# Patient Record
Sex: Male | Born: 1965 | Race: White | Hispanic: No | Smoking: Never smoker
Health system: Southern US, Community
[De-identification: ages and names within clinical notes are randomized; demographics above are authoritative.]

## PROBLEM LIST (undated history)

## (undated) DIAGNOSIS — J45909 Unspecified asthma, uncomplicated: Secondary | ICD-10-CM

## (undated) DIAGNOSIS — K219 Gastro-esophageal reflux disease without esophagitis: Secondary | ICD-10-CM

## (undated) DIAGNOSIS — G4730 Sleep apnea, unspecified: Secondary | ICD-10-CM

## (undated) DIAGNOSIS — M7989 Other specified soft tissue disorders: Secondary | ICD-10-CM

## (undated) DIAGNOSIS — Z87442 Personal history of urinary calculi: Secondary | ICD-10-CM

## (undated) DIAGNOSIS — I10 Essential (primary) hypertension: Secondary | ICD-10-CM

## (undated) DIAGNOSIS — E119 Type 2 diabetes mellitus without complications: Secondary | ICD-10-CM

## (undated) DIAGNOSIS — F32A Depression, unspecified: Secondary | ICD-10-CM

## (undated) DIAGNOSIS — F419 Anxiety disorder, unspecified: Secondary | ICD-10-CM

## (undated) DIAGNOSIS — F329 Major depressive disorder, single episode, unspecified: Secondary | ICD-10-CM

## (undated) HISTORY — PX: OTHER SURGICAL HISTORY: SHX169

## (undated) HISTORY — PX: WISDOM TOOTH EXTRACTION: SHX21

## (undated) HISTORY — PX: TENNIS ELBOW RELEASE/NIRSCHEL PROCEDURE: SHX6651

## (undated) HISTORY — PX: HERNIA REPAIR: SHX51

## (undated) HISTORY — PX: CARPAL TUNNEL RELEASE: SHX101

---

## 2002-05-05 ENCOUNTER — Encounter: Payer: Self-pay | Admitting: Orthopedic Surgery

## 2002-05-05 ENCOUNTER — Ambulatory Visit (HOSPITAL_COMMUNITY): Admission: RE | Admit: 2002-05-05 | Discharge: 2002-05-05 | Payer: Self-pay | Admitting: Orthopedic Surgery

## 2005-03-13 ENCOUNTER — Ambulatory Visit: Payer: Self-pay

## 2005-04-02 ENCOUNTER — Ambulatory Visit: Payer: Self-pay

## 2005-06-19 ENCOUNTER — Ambulatory Visit: Payer: Self-pay

## 2005-06-26 ENCOUNTER — Ambulatory Visit: Payer: Self-pay | Admitting: Internal Medicine

## 2005-08-22 ENCOUNTER — Ambulatory Visit (HOSPITAL_COMMUNITY): Admission: RE | Admit: 2005-08-22 | Discharge: 2005-08-22 | Payer: Self-pay | Admitting: Orthopedic Surgery

## 2007-01-26 ENCOUNTER — Emergency Department: Payer: Self-pay | Admitting: Emergency Medicine

## 2008-05-06 ENCOUNTER — Emergency Department: Payer: Self-pay | Admitting: Emergency Medicine

## 2014-03-22 ENCOUNTER — Ambulatory Visit: Payer: Self-pay | Admitting: General Surgery

## 2014-04-14 ENCOUNTER — Ambulatory Visit: Payer: Self-pay | Admitting: General Surgery

## 2014-05-06 ENCOUNTER — Ambulatory Visit: Payer: Self-pay | Admitting: General Surgery

## 2014-05-11 ENCOUNTER — Encounter: Payer: Self-pay | Admitting: *Deleted

## 2017-06-03 ENCOUNTER — Ambulatory Visit: Payer: Self-pay | Admitting: Urology

## 2017-06-03 ENCOUNTER — Encounter: Payer: Self-pay | Admitting: Urology

## 2017-08-21 ENCOUNTER — Encounter
Admission: RE | Disposition: A | Discharge status: Home / Self Care | Payer: Self-pay | Source: Ambulatory Visit | Attending: Internal Medicine

## 2017-08-21 ENCOUNTER — Ambulatory Visit
Admission: RE | Admit: 2017-08-21 | Discharge: 2017-08-21 | Disposition: A | Discharge status: Home / Self Care | Payer: BLUE CROSS/BLUE SHIELD | Source: Ambulatory Visit | Attending: Internal Medicine | Admitting: Internal Medicine

## 2017-08-21 DIAGNOSIS — F329 Major depressive disorder, single episode, unspecified: Secondary | ICD-10-CM | POA: Diagnosis not present

## 2017-08-21 DIAGNOSIS — Z79899 Other long term (current) drug therapy: Secondary | ICD-10-CM | POA: Insufficient documentation

## 2017-08-21 DIAGNOSIS — J45909 Unspecified asthma, uncomplicated: Secondary | ICD-10-CM | POA: Insufficient documentation

## 2017-08-21 DIAGNOSIS — F419 Anxiety disorder, unspecified: Secondary | ICD-10-CM | POA: Insufficient documentation

## 2017-08-21 DIAGNOSIS — R0602 Shortness of breath: Secondary | ICD-10-CM | POA: Insufficient documentation

## 2017-08-21 DIAGNOSIS — R0681 Apnea, not elsewhere classified: Secondary | ICD-10-CM | POA: Diagnosis not present

## 2017-08-21 DIAGNOSIS — R079 Chest pain, unspecified: Secondary | ICD-10-CM | POA: Diagnosis present

## 2017-08-21 DIAGNOSIS — K219 Gastro-esophageal reflux disease without esophagitis: Secondary | ICD-10-CM | POA: Diagnosis not present

## 2017-08-21 HISTORY — PX: LEFT HEART CATH AND CORONARY ANGIOGRAPHY: CATH118249

## 2017-08-21 HISTORY — DX: Gastro-esophageal reflux disease without esophagitis: K21.9

## 2017-08-21 HISTORY — DX: Major depressive disorder, single episode, unspecified: F32.9

## 2017-08-21 HISTORY — DX: Unspecified asthma, uncomplicated: J45.909

## 2017-08-21 HISTORY — DX: Sleep apnea, unspecified: G47.30

## 2017-08-21 HISTORY — DX: Depression, unspecified: F32.A

## 2017-08-21 HISTORY — DX: Anxiety disorder, unspecified: F41.9

## 2017-08-21 LAB — CARDIAC CATHETERIZATION: Cath EF Quantitative: 55 %

## 2017-08-21 SURGERY — LEFT HEART CATH AND CORONARY ANGIOGRAPHY
Anesthesia: Moderate Sedation

## 2017-08-21 SURGERY — LEFT HEART CATH AND CORONARY ANGIOGRAPHY
Anesthesia: Moderate Sedation | Laterality: Left

## 2017-08-21 MED ORDER — IOPAMIDOL (ISOVUE-300) INJECTION 61%
INTRAVENOUS | Status: DC | PRN
  Administered 2017-08-21: 90 mL via INTRA_ARTERIAL

## 2017-08-21 MED ORDER — HEPARIN (PORCINE) IN NACL 2-0.9 UNIT/ML-% IJ SOLN
INTRAMUSCULAR | Status: AC
  Filled 2017-08-21: qty 500

## 2017-08-21 MED ORDER — MIDAZOLAM HCL 2 MG/2ML IJ SOLN
INTRAMUSCULAR | Status: DC | PRN
  Administered 2017-08-21: 1 mg via INTRAVENOUS

## 2017-08-21 MED ORDER — SODIUM CHLORIDE 0.9% FLUSH: 3.0000 mL | INTRAVENOUS | Status: DC | PRN

## 2017-08-21 MED ORDER — SODIUM CHLORIDE 0.9 % IV SOLN: 250.0000 mL | INTRAVENOUS | Status: DC | PRN

## 2017-08-21 MED ORDER — SODIUM CHLORIDE 0.9 % WEIGHT BASED INFUSION
3.0000 mL/kg/h | INTRAVENOUS | Status: AC
  Administered 2017-08-21: 3 mL/kg/h via INTRAVENOUS

## 2017-08-21 MED ORDER — SODIUM CHLORIDE 0.9 % WEIGHT BASED INFUSION: 1.0000 mL/kg/h | INTRAVENOUS | Status: DC

## 2017-08-21 MED ORDER — MIDAZOLAM HCL 2 MG/2ML IJ SOLN
INTRAMUSCULAR | Status: AC
  Filled 2017-08-21: qty 2

## 2017-08-21 MED ORDER — SODIUM CHLORIDE 0.9% FLUSH: 3.0000 mL | Freq: Two times a day (BID) | INTRAVENOUS | Status: DC

## 2017-08-21 MED ORDER — FENTANYL CITRATE (PF) 100 MCG/2ML IJ SOLN
INTRAMUSCULAR | Status: AC
  Filled 2017-08-21: qty 2

## 2017-08-21 MED ORDER — ASPIRIN 81 MG PO CHEW
81.0000 mg | CHEWABLE_TABLET | ORAL | Status: AC
  Administered 2017-08-21: 81 mg via ORAL

## 2017-08-21 MED ORDER — FENTANYL CITRATE (PF) 100 MCG/2ML IJ SOLN
INTRAMUSCULAR | Status: DC | PRN
  Administered 2017-08-21: 25 ug via INTRAVENOUS

## 2017-08-21 MED ORDER — LIDOCAINE HCL (PF) 1 % IJ SOLN
INTRAMUSCULAR | Status: DC | PRN
  Administered 2017-08-21: 15 mL

## 2017-08-21 MED ORDER — LIDOCAINE HCL (PF) 1 % IJ SOLN
INTRAMUSCULAR | Status: AC
  Filled 2017-08-21: qty 30

## 2017-08-21 MED ORDER — ASPIRIN 81 MG PO CHEW
CHEWABLE_TABLET | ORAL | Status: AC
  Filled 2017-08-21: qty 1

## 2017-08-21 SURGICAL SUPPLY — 9 items
CATH 5FR PIGTAIL DIAGNOSTIC (CATHETERS) ×3 IMPLANT
CATH INFINITI 5FR JL4 (CATHETERS) ×3 IMPLANT
CATH INFINITI JR4 5F (CATHETERS) ×3 IMPLANT
DEVICE CLOSURE MYNXGRIP 5F (Vascular Products) ×3 IMPLANT
GUIDEWIRE 3MM J TIP .035 145 (WIRE) ×3 IMPLANT
KIT MANI 3VAL PERCEP (MISCELLANEOUS) ×3 IMPLANT
NEEDLE PERC 18GX7CM (NEEDLE) ×3 IMPLANT
PACK CARDIAC CATH (CUSTOM PROCEDURE TRAY) ×3 IMPLANT
SHEATH AVANTI 5FR X 11CM (SHEATH) ×3 IMPLANT

## 2017-08-21 NOTE — Discharge Instructions (Signed)

## 2017-08-22 ENCOUNTER — Encounter: Payer: Self-pay | Admitting: Internal Medicine

## 2017-08-26 ENCOUNTER — Other Ambulatory Visit: Payer: Self-pay | Admitting: Internal Medicine

## 2017-08-26 DIAGNOSIS — I729 Aneurysm of unspecified site: Secondary | ICD-10-CM

## 2017-08-27 ENCOUNTER — Ambulatory Visit
Admission: RE | Admit: 2017-08-27 | Discharge: 2017-08-27 | Disposition: A | Discharge status: Home / Self Care | Payer: BLUE CROSS/BLUE SHIELD | Source: Ambulatory Visit | Attending: Internal Medicine | Admitting: Internal Medicine

## 2017-08-27 DIAGNOSIS — I729 Aneurysm of unspecified site: Secondary | ICD-10-CM | POA: Diagnosis present

## 2017-08-27 DIAGNOSIS — M7981 Nontraumatic hematoma of soft tissue: Secondary | ICD-10-CM | POA: Insufficient documentation

## 2017-10-31 ENCOUNTER — Encounter: Payer: Self-pay | Admitting: *Deleted

## 2017-10-31 ENCOUNTER — Other Ambulatory Visit: Payer: Self-pay

## 2017-11-05 NOTE — Anesthesia Preprocedure Evaluation (Addendum)
Anesthesia Evaluation  Patient identified by MRN, date of birth, ID band Patient awake    History of Anesthesia Complications Negative for: history of anesthetic complications  Airway Mallampati: III  TM Distance: >3 FB Neck ROM: Full    Dental  (+)    Pulmonary asthma , sleep apnea ,    Pulmonary exam normal breath sounds clear to auscultation       Cardiovascular Exercise Tolerance: Good negative cardio ROS Normal cardiovascular exam Rhythm:Regular Rate:Normal  Cardiac cath 08/21/17: Normal cardiac cath Normal LVF EF=55% Normal coronaries   Neuro/Psych PSYCHIATRIC DISORDERS Anxiety Depression negative neurological ROS     GI/Hepatic GERD  ,  Endo/Other  negative endocrine ROS  Renal/GU Renal disease (nephrolithiasis)     Musculoskeletal   Abdominal   Peds  Hematology negative hematology ROS (+)   Anesthesia Other Findings   Reproductive/Obstetrics                            Anesthesia Physical Anesthesia Plan  ASA: II  Anesthesia Plan: General and Bier Block and Bier Block-LIDOCAINE ONLY   Post-op Pain Management:  Regional for Post-op pain and GA combined w/ Regional for post-op pain   Induction: Intravenous  PONV Risk Score and Plan: 1 and Ondansetron  Airway Management Planned: LMA  Additional Equipment:   Intra-op Plan:   Post-operative Plan: Extubation in OR  Informed Consent: I have reviewed the patients History and Physical, chart, labs and discussed the procedure including the risks, benefits and alternatives for the proposed anesthesia with the patient or authorized representative who has indicated his/her understanding and acceptance.     Plan Discussed with: CRNA  Anesthesia Plan Comments:        Anesthesia Quick Evaluation

## 2017-11-06 ENCOUNTER — Encounter
Admission: RE | Disposition: A | Discharge status: Home / Self Care | Payer: Self-pay | Source: Ambulatory Visit | Attending: Surgery

## 2017-11-06 ENCOUNTER — Ambulatory Visit: Payer: BLUE CROSS/BLUE SHIELD | Admitting: Anesthesiology

## 2017-11-06 ENCOUNTER — Ambulatory Visit
Admission: RE | Admit: 2017-11-06 | Discharge: 2017-11-06 | Disposition: A | Discharge status: Home / Self Care | Payer: BLUE CROSS/BLUE SHIELD | Source: Ambulatory Visit | Attending: Surgery | Admitting: Surgery

## 2017-11-06 DIAGNOSIS — G5602 Carpal tunnel syndrome, left upper limb: Secondary | ICD-10-CM | POA: Diagnosis present

## 2017-11-06 DIAGNOSIS — Z79899 Other long term (current) drug therapy: Secondary | ICD-10-CM | POA: Insufficient documentation

## 2017-11-06 DIAGNOSIS — Z9889 Other specified postprocedural states: Secondary | ICD-10-CM | POA: Insufficient documentation

## 2017-11-06 DIAGNOSIS — Z87442 Personal history of urinary calculi: Secondary | ICD-10-CM | POA: Insufficient documentation

## 2017-11-06 DIAGNOSIS — J45909 Unspecified asthma, uncomplicated: Secondary | ICD-10-CM | POA: Diagnosis not present

## 2017-11-06 DIAGNOSIS — F329 Major depressive disorder, single episode, unspecified: Secondary | ICD-10-CM | POA: Insufficient documentation

## 2017-11-06 DIAGNOSIS — G4730 Sleep apnea, unspecified: Secondary | ICD-10-CM | POA: Diagnosis not present

## 2017-11-06 DIAGNOSIS — F419 Anxiety disorder, unspecified: Secondary | ICD-10-CM | POA: Insufficient documentation

## 2017-11-06 HISTORY — DX: Personal history of urinary calculi: Z87.442

## 2017-11-06 HISTORY — PX: CARPAL TUNNEL RELEASE: SHX101

## 2017-11-06 SURGERY — CARPAL TUNNEL RELEASE
Anesthesia: Regional | Laterality: Left | Wound class: Clean

## 2017-11-06 MED ORDER — MIDAZOLAM HCL 5 MG/5ML IJ SOLN
INTRAMUSCULAR | Status: DC | PRN
  Administered 2017-11-06: 2 mg via INTRAVENOUS

## 2017-11-06 MED ORDER — FENTANYL CITRATE (PF) 100 MCG/2ML IJ SOLN: 25.0000 ug | INTRAMUSCULAR | Status: DC | PRN

## 2017-11-06 MED ORDER — PROPOFOL 500 MG/50ML IV EMUL
INTRAVENOUS | Status: DC | PRN
  Administered 2017-11-06: 75 ug/kg/min via INTRAVENOUS

## 2017-11-06 MED ORDER — ACETAMINOPHEN 10 MG/ML IV SOLN: 1000.0000 mg | Freq: Once | INTRAVENOUS | Status: DC | PRN

## 2017-11-06 MED ORDER — OXYCODONE HCL 5 MG PO TABS: 5.0000 mg | ORAL_TABLET | ORAL | 0 refills | Status: DC | PRN

## 2017-11-06 MED ORDER — FENTANYL CITRATE (PF) 100 MCG/2ML IJ SOLN
INTRAMUSCULAR | Status: DC | PRN
  Administered 2017-11-06: 100 ug via INTRAVENOUS

## 2017-11-06 MED ORDER — LIDOCAINE HCL (CARDIAC) 20 MG/ML IV SOLN
INTRAVENOUS | Status: DC | PRN
  Administered 2017-11-06: 50 mg via INTRAVENOUS

## 2017-11-06 MED ORDER — OXYCODONE HCL 5 MG PO TABS: 5.0000 mg | ORAL_TABLET | Freq: Once | ORAL | Status: DC | PRN

## 2017-11-06 MED ORDER — LACTATED RINGERS IV SOLN: INTRAVENOUS | Status: DC

## 2017-11-06 MED ORDER — CEFAZOLIN SODIUM 1 G IJ SOLR
2000.0000 mg | Freq: Once | INTRAMUSCULAR | Status: AC
  Administered 2017-11-06: 2000 mg via INTRAVENOUS

## 2017-11-06 MED ORDER — BUPIVACAINE HCL (PF) 0.5 % IJ SOLN
INTRAMUSCULAR | Status: DC | PRN
  Administered 2017-11-06: 10 mL

## 2017-11-06 MED ORDER — OXYCODONE HCL 5 MG/5ML PO SOLN: 5.0000 mg | Freq: Once | ORAL | Status: DC | PRN

## 2017-11-06 MED ORDER — LACTATED RINGERS IV SOLN
INTRAVENOUS | Status: DC
  Administered 2017-11-06: 12:00:00 via INTRAVENOUS

## 2017-11-06 MED ORDER — ONDANSETRON HCL 4 MG/2ML IJ SOLN: 4.0000 mg | Freq: Once | INTRAMUSCULAR | Status: DC | PRN

## 2017-11-06 MED ORDER — ONDANSETRON HCL 4 MG/2ML IJ SOLN
INTRAMUSCULAR | Status: DC | PRN
  Administered 2017-11-06: 4 mg via INTRAVENOUS

## 2017-11-06 SURGICAL SUPPLY — 25 items
BANDAGE ELASTIC 2 LF NS (GAUZE/BANDAGES/DRESSINGS) ×2 IMPLANT
BNDG COHESIVE 4X5 TAN STRL (GAUZE/BANDAGES/DRESSINGS) ×2 IMPLANT
BNDG ESMARK 4X12 TAN STRL LF (GAUZE/BANDAGES/DRESSINGS) ×2 IMPLANT
CHLORAPREP W/TINT 26ML (MISCELLANEOUS) ×2 IMPLANT
CORD BIP STRL DISP 12FT (MISCELLANEOUS) ×2 IMPLANT
COVER LIGHT HANDLE UNIVERSAL (MISCELLANEOUS) ×4 IMPLANT
CUFF TOURNIQUET DUAL PORT 18X3 (MISCELLANEOUS) ×2 IMPLANT
DRAPE SURG 17X11 SM STRL (DRAPES) ×2 IMPLANT
GAUZE PETRO XEROFOAM 1X8 (MISCELLANEOUS) ×2 IMPLANT
GAUZE SPONGE 4X4 12PLY STRL (GAUZE/BANDAGES/DRESSINGS) ×2 IMPLANT
GLOVE BIO SURGEON STRL SZ8 (GLOVE) ×2 IMPLANT
GLOVE INDICATOR 8.0 STRL GRN (GLOVE) ×2 IMPLANT
GOWN STRL REUS W/ TWL LRG LVL3 (GOWN DISPOSABLE) ×1 IMPLANT
GOWN STRL REUS W/ TWL XL LVL3 (GOWN DISPOSABLE) ×1 IMPLANT
GOWN STRL REUS W/TWL LRG LVL3 (GOWN DISPOSABLE) ×1
GOWN STRL REUS W/TWL XL LVL3 (GOWN DISPOSABLE) ×1
KIT CARPAL TUNNEL (MISCELLANEOUS) ×1
KIT ESCP INSRT D SLOT CANN KN (MISCELLANEOUS) ×1 IMPLANT
KIT ROOM TURNOVER OR (KITS) ×2 IMPLANT
NS IRRIG 500ML POUR BTL (IV SOLUTION) ×2 IMPLANT
PACK EXTREMITY ARMC (MISCELLANEOUS) ×2 IMPLANT
SPLINT WRIST LG LT TX990309 (SOFTGOODS) ×2 IMPLANT
STOCKINETTE IMPERVIOUS 9X36 MD (GAUZE/BANDAGES/DRESSINGS) ×2 IMPLANT
STRAP BODY AND KNEE 60X3 (MISCELLANEOUS) ×2 IMPLANT
SUT PROLENE 4 0 PS 2 18 (SUTURE) ×2 IMPLANT

## 2017-11-06 NOTE — Anesthesia Procedure Notes (Addendum)
Anesthesia Regional Block: Bier block (IV Regional)   Pre-Anesthetic Checklist: ,, timeout performed, Correct Patient, Correct Site, Correct Laterality, Correct Procedure, Correct Position, site marked, Risks and benefits discussed,  Surgical consent,  Pre-op evaluation,  At surgeon's request and post-op pain management  Laterality: Left      Procedures:,,,,, intact distal pulses, Esmarch exsanguination,, #20gu IV placed and double tourniquet utilized  Narrative:  Start time: 11/06/2017 12:35 PM End time: 11/06/2017 12:37 PM  Additional Notes: Double tourniquet utilized, applied to L upper arm. Esmarch exsanguination without difficulty. Proximal and distal cuffs inflated. Negative radial pulse noted. Distal cuff deflated. Injection of 67mL of 0.5% plain lidocaine through 22g angio in L hand. Tolerated well by pt. 20g angio d/c'd without difficulty.

## 2017-11-06 NOTE — Anesthesia Postprocedure Evaluation (Signed)
Anesthesia Post Note  Patient: Joel Wheeler  Procedure(s) Performed: CARPAL TUNNEL RELEASE (Left )  Patient location during evaluation: PACU Anesthesia Type: Bier Block Level of consciousness: awake and alert, oriented and patient cooperative Pain management: pain level controlled Vital Signs Assessment: post-procedure vital signs reviewed and stable Respiratory status: spontaneous breathing, nonlabored ventilation and respiratory function stable Cardiovascular status: blood pressure returned to baseline and stable Postop Assessment: adequate PO intake Anesthetic complications: no    Darrin Nipper

## 2017-11-06 NOTE — Discharge Instructions (Signed)
General Anesthesia, Adult, Care After These instructions provide you with information about caring for yourself after your procedure. Your health care provider may also give you more specific instructions. Your treatment has been planned according to current medical practices, but problems sometimes occur. Call your health care provider if you have any problems or questions after your procedure. What can I expect after the procedure? After the procedure, it is common to have:  Vomiting.  A sore throat.  Mental slowness.  It is common to feel:  Nauseous.  Cold or shivery.  Sleepy.  Tired.  Sore or achy, even in parts of your body where you did not have surgery.  Follow these instructions at home: For at least 24 hours after the procedure:  Do not: ? Participate in activities where you could fall or become injured. ? Drive. ? Use heavy machinery. ? Drink alcohol. ? Take sleeping pills or medicines that cause drowsiness. ? Make important decisions or sign legal documents. ? Take care of children on your own.  Rest. Eating and drinking  If you vomit, drink water, juice, or soup when you can drink without vomiting.  Drink enough fluid to keep your urine clear or pale yellow.  Make sure you have little or no nausea before eating solid foods.  Follow the diet recommended by your health care provider. General instructions  Have a responsible adult stay with you until you are awake and alert.  Return to your normal activities as told by your health care provider. Ask your health care provider what activities are safe for you.  Take over-the-counter and prescription medicines only as told by your health care provider.  If you smoke, do not smoke without supervision.  Keep all follow-up visits as told by your health care provider. This is important. Contact a health care provider if:  You continue to have nausea or vomiting at home, and medicines are not helpful.  You  cannot drink fluids or start eating again.  You cannot urinate after 8-12 hours.  You develop a skin rash.  You have fever.  You have increasing redness at the site of your procedure. Get help right away if:  You have difficulty breathing.  You have chest pain.  You have unexpected bleeding.  You feel that you are having a life-threatening or urgent problem. This information is not intended to replace advice given to you by your health care provider. Make sure you discuss any questions you have with your health care provider. Document Released: 02/25/2001 Document Revised: 04/23/2016 Document Reviewed: 11/03/2015 Elsevier Interactive Patient Education  2018 [employer].   Keep dressing dry and intact. Keep hand elevated above heart level. May shower after dressing removed on postop day 4 (Sunday). Cover sutures with Band-Aids after drying off. Apply ice to affected area frequently. Take ibuprofen 800 mg TID with meals for 7-10 days, then as necessary. Take ES Tylenol or pain medication as prescribed when needed.  Return for follow-up in 10-14 days or as scheduled.

## 2017-11-06 NOTE — Anesthesia Procedure Notes (Signed)
Performed by: Dael Howland, CRNA Pre-anesthesia Checklist: Patient identified, Emergency Drugs available, Suction available, Timeout performed and Patient being monitored Patient Re-evaluated:Patient Re-evaluated prior to induction Oxygen Delivery Method: Simple face mask Placement Confirmation: positive ETCO2       

## 2017-11-06 NOTE — H&P (Signed)
Paper H&P to be scanned into permanent record. H&P reviewed and patient re-examined. No changes. 

## 2017-11-06 NOTE — Op Note (Signed)
11/06/2017  1:12 PM  Patient:   Joel Wheeler  Pre-Op Diagnosis:   Left carpal tunnel syndrome.  Post-Op Diagnosis:   Same.  Procedure:   Endoscopic left carpal tunnel release.  Surgeon:   Pascal Lux, MD  Anesthesia:   Bier block  Findings:   As above.  Complications:   None  EBL:   0 cc  Fluids:   800 cc crystalloid  TT:   29 minutes at 250 mmHg  Drains:   None  Closure:   4-0 Prolene interrupted sutures  Brief Clinical Note:   The patient is a 51 year old male with a history of pain and paresthesias of his left hand. His symptoms have persisted despite medications, activity modification, splinting, etc. His history and examination are consistent with carpal tunnel syndrome. The patient presents at this time for an endoscopic left carpal tunnel release.   Procedure:   The patient was brought into the operating room and lain in the supine position. After adequate IV sedation was achieved, a timeout was performed to verify the appropriate surgical site before a Bier block was placed by the anesthesiologist and the tourniquet inflated to 250 mmHg. The left hand and upper extremity were prepped with ChloraPrep solution before being draped sterilely. Preoperative antibiotics were administered. An approximately 1.5-2 cm incision was made over the volar wrist flexion crease, centered over the palmaris longus tendon. The incision was carried down through the subcutaneous tissues with care taken to identify and protect any neurovascular structures. The distal forearm fascia was penetrated just proximal to the transverse carpal ligament. The soft tissues were released off the superficial and deep surfaces of the distal forearm fascia and this was released proximally for 3-4 cm under direct visualization.  Attention was directed distally. The Soil scientist was passed beneath the transverse carpal ligament along the ulnar aspect of the carpal tunnel and used to release any  adhesions as well as to remove any adherent synovial tissue before first the smaller then the larger of the two dilators were passed beneath the transverse carpal ligament along the ulnar margin of the carpal tunnel. The slotted cannula was introduced and the endoscope was placed into the slotted cannula and the undersurface of the transverse carpal ligament visualized. The distal margin of the transverse carpal ligament was marked by placing a 25-gauge needle percutaneously at [place] cardinal point so that it entered the distal portion of the slotted cannula. Under endoscopic visualization, the transverse carpal ligament was released from proximal to distal using the end-cutting blade. A second pass was performed to ensure complete release of the ligament. The adequacy of release was verified both endoscopically and by palpation using the freer elevator.  The wound was irrigated thoroughly with sterile saline solution before being closed using 4-0 Prolene interrupted sutures. A total of 10 cc of 0.5% plain Sensorcaine was injected in and around the incision before a sterile bulky dressing was applied to the wound. The patient was placed into a volar wrist splint before being awakened and returned to the recovery room in satisfactory condition after tolerating the procedure well.

## 2017-11-06 NOTE — Transfer of Care (Signed)
Immediate Anesthesia Transfer of Care Note  Patient: Joel Wheeler  Procedure(s) Performed: CARPAL TUNNEL RELEASE (Left )  Patient Location: PACU  Anesthesia Type: General, Bier Block  Level of Consciousness: awake, alert  and patient cooperative  Airway and Oxygen Therapy: Patient Spontanous Breathing and Patient connected to supplemental oxygen  Post-op Assessment: Post-op Vital signs reviewed, Patient's Cardiovascular Status Stable, Respiratory Function Stable, Patent Airway and No signs of Nausea or vomiting  Post-op Vital Signs: Reviewed and stable  Complications: No apparent anesthesia complications

## 2017-11-08 ENCOUNTER — Encounter: Payer: Self-pay | Admitting: Surgery

## 2017-12-20 ENCOUNTER — Other Ambulatory Visit: Payer: Self-pay | Admitting: Surgery

## 2017-12-20 DIAGNOSIS — M7582 Other shoulder lesions, left shoulder: Secondary | ICD-10-CM

## 2018-01-14 ENCOUNTER — Ambulatory Visit
Admission: RE | Admit: 2018-01-14 | Discharge: 2018-01-14 | Disposition: A | Discharge status: Home / Self Care | Payer: BLUE CROSS/BLUE SHIELD | Source: Ambulatory Visit | Attending: Surgery | Admitting: Surgery

## 2018-01-14 DIAGNOSIS — M7582 Other shoulder lesions, left shoulder: Secondary | ICD-10-CM

## 2018-01-22 ENCOUNTER — Ambulatory Visit
Admission: RE | Admit: 2018-01-22 | Discharge: 2018-01-22 | Disposition: A | Discharge status: Home / Self Care | Payer: BLUE CROSS/BLUE SHIELD | Source: Ambulatory Visit | Attending: Surgery | Admitting: Surgery

## 2018-01-22 DIAGNOSIS — M75122 Complete rotator cuff tear or rupture of left shoulder, not specified as traumatic: Secondary | ICD-10-CM | POA: Diagnosis not present

## 2018-01-22 DIAGNOSIS — M19012 Primary osteoarthritis, left shoulder: Secondary | ICD-10-CM | POA: Insufficient documentation

## 2018-01-22 DIAGNOSIS — M7582 Other shoulder lesions, left shoulder: Secondary | ICD-10-CM | POA: Diagnosis present

## 2018-02-25 ENCOUNTER — Other Ambulatory Visit: Payer: Self-pay

## 2018-02-25 ENCOUNTER — Encounter
Admission: RE | Admit: 2018-02-25 | Discharge: 2018-02-25 | Disposition: A | Discharge status: Home / Self Care | Payer: BLUE CROSS/BLUE SHIELD | Source: Ambulatory Visit | Attending: Surgery | Admitting: Surgery

## 2018-02-25 DIAGNOSIS — Z01818 Encounter for other preprocedural examination: Secondary | ICD-10-CM | POA: Diagnosis not present

## 2018-02-25 NOTE — Patient Instructions (Signed)
Your procedure is scheduled on: March 06, 2018 THURSDAY Report to Day Surgery on the 2nd floor of the [facility]. To find out your arrival time, please call [phone] between 1PM - 3PM on: March 05, 2018 [facility]  REMEMBER: Instructions that are not followed completely may result in serious medical risk, up to and including death; or upon the discretion of your surgeon and anesthesiologist your surgery may need to be rescheduled.  Do not eat food after midnight the night before your procedure.  No gum chewing, lozengers or hard candies.  You may however, drink CLEAR liquids up to 2 hours before you are scheduled to arrive for your surgery. Do not drink anything within 2 hours of the start of your surgery.  Clear liquids include: - water  - apple juice without pulp - clear gatorade - black coffee or tea (Do NOT add anything to the coffee or tea) Do NOT drink anything that is not on this list.    No Alcohol for 24 hours before or after surgery.  No Smoking including e-cigarettes for 24 hours prior to surgery.  No chewable tobacco products for at least 6 hours prior to surgery.  No nicotine patches on the day of surgery.  On the morning of surgery brush your teeth with toothpaste and water, you may rinse your mouth with mouthwash if you wish. Do not swallow any toothpaste or mouthwash.  Notify your doctor if there is any change in your medical condition (cold, fever, infection).  Do not wear jewelry, make-up, hairpins, clips or nail polish.  Do not wear lotions, powders, or perfumes. You may NOT wear deodorant.  Do not shave 48 hours prior to surgery. Men may shave face and neck.  Contacts and dentures may not be worn into surgery.  Do not bring valuables to the hospital, including drivers license, insurance or credit cards.  [facility] is not responsible for any belongings or valuables.   TAKE THESE MEDICATIONS THE MORNING OF SURGERY: OMEPRAZOLE- TAKE A DOSE THE NIGHT  BEFORE SURGERY AND THE MORNING OF SURGERY PAXIL IF IT IS MORNING MED PAIN PILL IF NEEDED  Use CHG Soap or wipes as directed on instruction sheet.   Use inhalers on the day of surgery   Follow recommendations from Cardiologist, Pulmonologist or PCP regarding stopping Aspirin, Coumadin, Plavix, Eliquis, Pradaxa, or Pletal.  Stop Anti-inflammatories (NSAIDS) such as Advil, Aleve, Ibuprofen, Motrin, Naproxen, Naprosyn and Aspirin based products such as Excedrin, Goodys Powder, BC Powder. (May take Tylenol or Acetaminophen if needed.)  Stop ANY OVER THE COUNTER supplements until after surgery. (May continue Vitamin D, Vitamin B, and multivitamin.)  Wear comfortable clothing (specific to your surgery type) to the hospital.  Plan for stool softeners for home use.  If you are being admitted to the hospital overnight, leave your suitcase in the car. After surgery it may be brought to your room.  If you are being discharged the day of surgery, you will not be allowed to drive home. You will need a responsible adult to drive you home and stay with you that night.   If you are taking public transportation, you will need to have a responsible adult with you. Please confirm with your physician that it is acceptable to use public transportation.   Please call [phone] if you have any questions about these instructions.

## 2018-03-05 MED ORDER — CEFAZOLIN SODIUM-DEXTROSE 2-4 GM/100ML-% IV SOLN
2.0000 g | Freq: Once | INTRAVENOUS | Status: AC
  Administered 2018-03-06: 2 g via INTRAVENOUS

## 2018-03-06 ENCOUNTER — Other Ambulatory Visit: Payer: Self-pay

## 2018-03-06 ENCOUNTER — Encounter
Admission: RE | Disposition: A | Discharge status: Home / Self Care | Payer: Self-pay | Source: Ambulatory Visit | Attending: Surgery

## 2018-03-06 ENCOUNTER — Ambulatory Visit: Payer: BLUE CROSS/BLUE SHIELD | Admitting: Anesthesiology

## 2018-03-06 ENCOUNTER — Ambulatory Visit
Admission: RE | Admit: 2018-03-06 | Discharge: 2018-03-06 | Disposition: A | Discharge status: Home / Self Care | Payer: BLUE CROSS/BLUE SHIELD | Source: Ambulatory Visit | Attending: Surgery | Admitting: Surgery

## 2018-03-06 DIAGNOSIS — K219 Gastro-esophageal reflux disease without esophagitis: Secondary | ICD-10-CM | POA: Diagnosis not present

## 2018-03-06 DIAGNOSIS — Z79899 Other long term (current) drug therapy: Secondary | ICD-10-CM | POA: Diagnosis not present

## 2018-03-06 DIAGNOSIS — J45909 Unspecified asthma, uncomplicated: Secondary | ICD-10-CM | POA: Insufficient documentation

## 2018-03-06 DIAGNOSIS — M65812 Other synovitis and tenosynovitis, left shoulder: Secondary | ICD-10-CM | POA: Insufficient documentation

## 2018-03-06 DIAGNOSIS — Z6837 Body mass index (BMI) 37.0-37.9, adult: Secondary | ICD-10-CM | POA: Insufficient documentation

## 2018-03-06 DIAGNOSIS — M75122 Complete rotator cuff tear or rupture of left shoulder, not specified as traumatic: Secondary | ICD-10-CM | POA: Diagnosis present

## 2018-03-06 DIAGNOSIS — M7522 Bicipital tendinitis, left shoulder: Secondary | ICD-10-CM | POA: Insufficient documentation

## 2018-03-06 DIAGNOSIS — E669 Obesity, unspecified: Secondary | ICD-10-CM | POA: Diagnosis not present

## 2018-03-06 DIAGNOSIS — G4730 Sleep apnea, unspecified: Secondary | ICD-10-CM | POA: Insufficient documentation

## 2018-03-06 DIAGNOSIS — F329 Major depressive disorder, single episode, unspecified: Secondary | ICD-10-CM | POA: Insufficient documentation

## 2018-03-06 DIAGNOSIS — F419 Anxiety disorder, unspecified: Secondary | ICD-10-CM | POA: Diagnosis not present

## 2018-03-06 DIAGNOSIS — M7542 Impingement syndrome of left shoulder: Secondary | ICD-10-CM | POA: Diagnosis not present

## 2018-03-06 HISTORY — PX: SHOULDER ARTHROSCOPY WITH OPEN ROTATOR CUFF REPAIR: SHX6092

## 2018-03-06 SURGERY — ARTHROSCOPY, SHOULDER WITH REPAIR, ROTATOR CUFF, OPEN
Anesthesia: General | Laterality: Left

## 2018-03-06 MED ORDER — MEPERIDINE HCL 50 MG/ML IJ SOLN: 6.2500 mg | INTRAMUSCULAR | Status: DC | PRN

## 2018-03-06 MED ORDER — OXYCODONE HCL 5 MG PO TABS: 5.0000 mg | ORAL_TABLET | Freq: Once | ORAL | Status: DC | PRN

## 2018-03-06 MED ORDER — FAMOTIDINE 20 MG PO TABS
ORAL_TABLET | ORAL | Status: AC
  Filled 2018-03-06: qty 1

## 2018-03-06 MED ORDER — FENTANYL CITRATE (PF) 100 MCG/2ML IJ SOLN
INTRAMUSCULAR | Status: DC | PRN
  Administered 2018-03-06 (×2): 50 ug via INTRAVENOUS

## 2018-03-06 MED ORDER — FENTANYL CITRATE (PF) 100 MCG/2ML IJ SOLN
50.0000 ug | Freq: Once | INTRAMUSCULAR | Status: AC
  Administered 2018-03-06: 50 ug via INTRAVENOUS

## 2018-03-06 MED ORDER — OXYCODONE HCL 5 MG/5ML PO SOLN: 5.0000 mg | Freq: Once | ORAL | Status: DC | PRN

## 2018-03-06 MED ORDER — DEXAMETHASONE SODIUM PHOSPHATE 10 MG/ML IJ SOLN
INTRAMUSCULAR | Status: AC
  Filled 2018-03-06: qty 1

## 2018-03-06 MED ORDER — PROPOFOL 10 MG/ML IV BOLUS
INTRAVENOUS | Status: DC | PRN
  Administered 2018-03-06: 170 mg via INTRAVENOUS

## 2018-03-06 MED ORDER — BUPIVACAINE-EPINEPHRINE (PF) 0.5% -1:200000 IJ SOLN
INTRAMUSCULAR | Status: AC
  Filled 2018-03-06: qty 30

## 2018-03-06 MED ORDER — BUPIVACAINE LIPOSOME 1.3 % IJ SUSP
INTRAMUSCULAR | Status: AC
  Filled 2018-03-06: qty 20

## 2018-03-06 MED ORDER — MIDAZOLAM HCL 2 MG/2ML IJ SOLN
1.0000 mg | Freq: Once | INTRAMUSCULAR | Status: AC
  Administered 2018-03-06: 1 mg via INTRAVENOUS

## 2018-03-06 MED ORDER — FENTANYL CITRATE (PF) 100 MCG/2ML IJ SOLN
INTRAMUSCULAR | Status: AC
  Filled 2018-03-06: qty 2

## 2018-03-06 MED ORDER — LIDOCAINE HCL (PF) 2 % IJ SOLN
INTRAMUSCULAR | Status: AC
  Filled 2018-03-06: qty 10

## 2018-03-06 MED ORDER — LACTATED RINGERS IV SOLN
INTRAVENOUS | Status: DC
  Administered 2018-03-06: 02:00:00 via INTRAVENOUS

## 2018-03-06 MED ORDER — LIDOCAINE HCL (PF) 1 % IJ SOLN
INTRAMUSCULAR | Status: DC | PRN
  Administered 2018-03-06: 3 mL

## 2018-03-06 MED ORDER — SUGAMMADEX SODIUM 200 MG/2ML IV SOLN
INTRAVENOUS | Status: DC | PRN
  Administered 2018-03-06 (×2): 200 mg via INTRAVENOUS

## 2018-03-06 MED ORDER — LACTATED RINGERS IV SOLN
INTRAVENOUS | Status: DC | PRN
  Administered 2018-03-06: 2 mL

## 2018-03-06 MED ORDER — BUPIVACAINE LIPOSOME 1.3 % IJ SUSP
INTRAMUSCULAR | Status: DC | PRN
  Administered 2018-03-06: 20 mL via PERINEURAL

## 2018-03-06 MED ORDER — PHENYLEPHRINE HCL 10 MG/ML IJ SOLN
INTRAMUSCULAR | Status: AC
  Filled 2018-03-06: qty 1

## 2018-03-06 MED ORDER — MIDAZOLAM HCL 2 MG/2ML IJ SOLN
INTRAMUSCULAR | Status: AC
  Administered 2018-03-06: 1 mg via INTRAVENOUS
  Filled 2018-03-06: qty 2

## 2018-03-06 MED ORDER — ROCURONIUM BROMIDE 100 MG/10ML IV SOLN
INTRAVENOUS | Status: DC | PRN
  Administered 2018-03-06 (×2): 10 mg via INTRAVENOUS
  Administered 2018-03-06: 20 mg via INTRAVENOUS
  Administered 2018-03-06: 10 mg via INTRAVENOUS
  Administered 2018-03-06: 50 mg via INTRAVENOUS

## 2018-03-06 MED ORDER — PHENYLEPHRINE HCL 10 MG/ML IJ SOLN
INTRAMUSCULAR | Status: DC | PRN
  Administered 2018-03-06 (×3): 200 ug via INTRAVENOUS

## 2018-03-06 MED ORDER — ACETAMINOPHEN 10 MG/ML IV SOLN
INTRAVENOUS | Status: DC | PRN
  Administered 2018-03-06: 1000 mg via INTRAVENOUS

## 2018-03-06 MED ORDER — PROMETHAZINE HCL 25 MG/ML IJ SOLN: 6.2500 mg | INTRAMUSCULAR | Status: DC | PRN

## 2018-03-06 MED ORDER — FENTANYL CITRATE (PF) 100 MCG/2ML IJ SOLN
INTRAMUSCULAR | Status: AC
  Administered 2018-03-06: 50 ug via INTRAVENOUS
  Filled 2018-03-06: qty 2

## 2018-03-06 MED ORDER — OXYCODONE HCL 5 MG PO TABS: 5.0000 mg | ORAL_TABLET | ORAL | 0 refills | Status: DC | PRN

## 2018-03-06 MED ORDER — BUPIVACAINE HCL (PF) 0.5 % IJ SOLN
INTRAMUSCULAR | Status: AC
  Filled 2018-03-06: qty 10

## 2018-03-06 MED ORDER — PHENYLEPHRINE HCL 10 MG/ML IJ SOLN
INTRAVENOUS | Status: DC | PRN
  Administered 2018-03-06: 25 ug/min via INTRAVENOUS

## 2018-03-06 MED ORDER — CEFAZOLIN SODIUM-DEXTROSE 2-4 GM/100ML-% IV SOLN
INTRAVENOUS | Status: AC
  Filled 2018-03-06: qty 100

## 2018-03-06 MED ORDER — LIDOCAINE HCL (CARDIAC) 20 MG/ML IV SOLN
INTRAVENOUS | Status: DC | PRN
  Administered 2018-03-06: 100 mg via INTRAVENOUS

## 2018-03-06 MED ORDER — LIDOCAINE HCL (PF) 1 % IJ SOLN
INTRAMUSCULAR | Status: AC
  Filled 2018-03-06: qty 5

## 2018-03-06 MED ORDER — FAMOTIDINE 20 MG PO TABS
20.0000 mg | ORAL_TABLET | Freq: Once | ORAL | Status: AC
  Administered 2018-03-06: 20 mg via ORAL

## 2018-03-06 MED ORDER — PROPOFOL 10 MG/ML IV BOLUS
INTRAVENOUS | Status: AC
  Filled 2018-03-06: qty 20

## 2018-03-06 MED ORDER — DEXAMETHASONE SODIUM PHOSPHATE 10 MG/ML IJ SOLN
INTRAMUSCULAR | Status: DC | PRN
  Administered 2018-03-06: 5 mg via INTRAVENOUS

## 2018-03-06 MED ORDER — ONDANSETRON HCL 4 MG/2ML IJ SOLN
INTRAMUSCULAR | Status: DC | PRN
  Administered 2018-03-06: 4 mg via INTRAVENOUS

## 2018-03-06 MED ORDER — BUPIVACAINE-EPINEPHRINE 0.5% -1:200000 IJ SOLN
INTRAMUSCULAR | Status: DC | PRN
  Administered 2018-03-06: 30 mL

## 2018-03-06 MED ORDER — BUPIVACAINE HCL (PF) 0.5 % IJ SOLN
INTRAMUSCULAR | Status: DC | PRN
  Administered 2018-03-06: 10 mL via PERINEURAL

## 2018-03-06 MED ORDER — ACETAMINOPHEN 10 MG/ML IV SOLN
INTRAVENOUS | Status: AC
  Filled 2018-03-06: qty 100

## 2018-03-06 MED ORDER — ROCURONIUM BROMIDE 50 MG/5ML IV SOLN
INTRAVENOUS | Status: AC
  Filled 2018-03-06: qty 3

## 2018-03-06 MED ORDER — FENTANYL CITRATE (PF) 100 MCG/2ML IJ SOLN: 25.0000 ug | INTRAMUSCULAR | Status: DC | PRN

## 2018-03-06 MED ORDER — EPINEPHRINE PF 1 MG/ML IJ SOLN
INTRAMUSCULAR | Status: AC
  Filled 2018-03-06: qty 2

## 2018-03-06 MED ORDER — ONDANSETRON HCL 4 MG/2ML IJ SOLN
INTRAMUSCULAR | Status: AC
  Filled 2018-03-06: qty 2

## 2018-03-06 SURGICAL SUPPLY — 42 items
ANCHOR JUGGERKNOT WTAP NDL 2.9 (Anchor) ×6 IMPLANT
ANCHOR SUT QUATTRO KNTLS 4.5 (Anchor) ×4 IMPLANT
BIT DRILL JUGRKNT W/NDL BIT2.9 (DRILL) IMPLANT
BLADE FULL RADIUS 3.5 (BLADE) ×3 IMPLANT
BUR ACROMIONIZER 4.0 (BURR) ×3 IMPLANT
CANNULA SHAVER 8MMX76MM (CANNULA) ×3 IMPLANT
CHLORAPREP W/TINT 26ML (MISCELLANEOUS) ×5 IMPLANT
COVER MAYO STAND STRL (DRAPES) ×3 IMPLANT
DRAPE IMP U-DRAPE 54X76 (DRAPES) ×6 IMPLANT
DRILL JUGGERKNOT W/NDL BIT 2.9 (DRILL)
ELECT REM PT RETURN 9FT ADLT (ELECTROSURGICAL) ×3
ELECTRODE REM PT RTRN 9FT ADLT (ELECTROSURGICAL) ×1 IMPLANT
GAUZE PETRO XEROFOAM 1X8 (MISCELLANEOUS) ×3 IMPLANT
GAUZE SPONGE 4X4 12PLY STRL (GAUZE/BANDAGES/DRESSINGS) ×3 IMPLANT
GLOVE BIO SURGEON STRL SZ7.5 (GLOVE) ×6 IMPLANT
GLOVE BIO SURGEON STRL SZ8 (GLOVE) ×8 IMPLANT
GLOVE BIOGEL PI IND STRL 8 (GLOVE) ×1 IMPLANT
GLOVE BIOGEL PI INDICATOR 8 (GLOVE) ×4
GLOVE INDICATOR 8.0 STRL GRN (GLOVE) ×5 IMPLANT
GOWN STRL REUS W/ TWL LRG LVL3 (GOWN DISPOSABLE) ×1 IMPLANT
GOWN STRL REUS W/ TWL XL LVL3 (GOWN DISPOSABLE) ×1 IMPLANT
GOWN STRL REUS W/TWL LRG LVL3 (GOWN DISPOSABLE) ×4
GOWN STRL REUS W/TWL XL LVL3 (GOWN DISPOSABLE) ×2
GRASPER SUT 15 45D LOW PRO (SUTURE) IMPLANT
IV LACTATED RINGER IRRG 3000ML (IV SOLUTION) ×4
IV LR IRRIG 3000ML ARTHROMATIC (IV SOLUTION) ×2 IMPLANT
MANIFOLD NEPTUNE II (INSTRUMENTS) ×3 IMPLANT
MASK FACE SPIDER DISP (MASK) ×3 IMPLANT
MAT BLUE FLOOR 46X72 FLO (MISCELLANEOUS) ×3 IMPLANT
PACK ARTHROSCOPY SHOULDER (MISCELLANEOUS) ×3 IMPLANT
SLING ARM LRG DEEP (SOFTGOODS) ×1 IMPLANT
SLING ULTRA II LG (MISCELLANEOUS) ×5 IMPLANT
STAPLER SKIN PROX 35W (STAPLE) ×3 IMPLANT
STRAP SAFETY 5IN WIDE (MISCELLANEOUS) ×3 IMPLANT
SUT ETHIBOND 0 MO6 C/R (SUTURE) ×3 IMPLANT
SUT VIC AB 2-0 CT1 27 (SUTURE) ×4
SUT VIC AB 2-0 CT1 TAPERPNT 27 (SUTURE) ×2 IMPLANT
TAPE MICROFOAM 4IN (TAPE) ×3 IMPLANT
TUBING ARTHRO INFLOW-ONLY STRL (TUBING) ×3 IMPLANT
TUBING CONNECTING 10 (TUBING) ×2 IMPLANT
TUBING CONNECTING 10' (TUBING) ×1
WAND HAND CNTRL MULTIVAC 90 (MISCELLANEOUS) ×3 IMPLANT

## 2018-03-06 NOTE — Op Note (Signed)
03/06/2018  2:54 PM  Patient:   Joel Wheeler  Pre-Op Diagnosis:   Impingement/tendinopathy with rotator cuff tear, left shoulder.  Post-Op Diagnosis: Impingement/tendinopathy with rotator cuff tear and biceps tendinopathy, left shoulder.  Procedure: Limited arthroscopic debridement, arthroscopic subacromial decompression, mini-open rotator cuff repair, and mini-open biceps tenodesis, left shoulder.  Anesthesia: General endotracheal with interscalene block placed preoperatively by the anesthesiologist.  Surgeon:   Pascal Lux, MD  Assistant:   Cameron Proud, PA-C  Findings: As above.  There was a full-thickness tear involving all of the insertional fibers of the supraspinatus tendon.  The remainder of the rotator cuff was in satisfactory condition.  There was significant tendinopathy of the biceps tendon with partial-thickness tearing.  The articular surfaces of the glenoid and humerus both were in satisfactory condition.  The labrum also was in satisfactory condition.  Complications: None  Fluids:   800 cc  Estimated blood loss: 5 cc  Tourniquet time: None  Drains: None  Closure: Staples   Brief clinical note: The patient is a 52 year old male with a history of left shoulder pain. The patient's symptoms have progressed despite medications, activity modification, etc. The patient's history and examination are consistent with impingement/tendinopathy with a rotator cuff tear. These findings were confirmed by MRI scan. The patient presents at this time for definitive management of these shoulder symptoms.  Procedure: The patient underwent placement of an interscalene block by the anesthesiologist in the preoperative holding area before being brought into the operating room and lain in the supine position. The patient then underwent general endotracheal intubation and anesthesia before being repositioned in the beach chair position using the beach  chair positioner. The left shoulder and upper extremity were prepped with ChloraPrep solution before being draped sterilely. Preoperative antibiotics were administered. A timeout was performed to confirm the proper surgical site before the expected portal sites and incision site were injected with 0.5% Sensorcaine with epinephrine. A posterior portal was created and the glenohumeral joint thoroughly inspected with the findings as described above. An anterior portal was created using an outside-in technique. The labrum and rotator cuff were further probed, again confirming the above-noted findings. Areas of synovitis as well as the frayed portion of the biceps tendon and rotator cuff were debrided back to stable margins using the full-radius resector. The ArthroCare wand was inserted and used to release the biceps tendon from its labral anchor, as well as to obtain hemostasis and to "anneal" the labrum superiorly and anteriorly. The instruments were removed from the joint after suctioning the excess fluid.  The camera was repositioned through the posterior portal into the subacromial space. A separate lateral portal was created using an outside-in technique. The 3.5 mm full-radius resector was introduced and used to perform a subtotal bursectomy. The ArthroCare wand was then inserted and used to remove the periosteal tissue off the undersurface of the anterior third of the acromion as well as to recess the coracoacromial ligament from its attachment along the anterior and lateral margins of the acromion. The 4.0 mm acromionizing bur was introduced and used to complete the decompression by removing the undersurface of the anterior third of the acromion. The full radius resector was reintroduced to remove any residual bony debris before the ArthroCare wand was reintroduced to obtain hemostasis. The instruments were then removed from the subacromial space after suctioning the excess fluid.  An approximately 4-5 cm  incision was made over the anterolateral aspect of the shoulder beginning at the anterolateral corner of  the acromion and extending distally in line with the bicipital groove. This incision was carried down through the subcutaneous tissues to expose the deltoid fascia. The raphae between the anterior and middle thirds was identified and this plane developed to provide access into the subacromial space. Additional bursal tissues were debrided sharply using Metzenbaum scissors. The rotator cuff tear was readily identified. The margins were debrided sharply with a #15 blade and the exposed greater tuberosity roughened with a rongeur. The tear was repaired using two Biomet 2.9 mm JuggerKnot anchors. These sutures were then brought back laterally and secured using two [place] QuatroLink anchors to create a two-layer closure. An apparent watertight closure was obtained.  The bicipital groove was identified by palpation and opened for 1-1.5 cm. The biceps tendon stump was retrieved through this defect. The floor of the bicipital groove was roughened with a curet before another Biomet 2.9 mm JuggerKnot anchor was inserted. Both sets of sutures were passed through the biceps tendon and tied securely to effect the tenodesis. The bicipital sheath was reapproximated using two #0 Ethibond interrupted sutures, incorporating the biceps tendon to further reinforce the tenodesis.  The wound was copiously irrigated with sterile saline solution before the deltoid raphae was reapproximated using 2-0 Vicryl interrupted sutures. The subcutaneous tissues were closed in two layers using 2-0 Vicryl interrupted sutures before the skin was closed using staples. The portal sites also were closed using staples. A sterile bulky dressing was applied to the shoulder before the arm was placed into a shoulder immobilizer. The patient was then awakened, extubated, and returned to the recovery room in satisfactory condition after tolerating the  procedure well.

## 2018-03-06 NOTE — Anesthesia Postprocedure Evaluation (Signed)
Anesthesia Post Note  Patient: Joel Wheeler  Procedure(s) Performed: SHOULDER ARTHROSCOPY WITH OPEN ROTATOR CUFF REPAIR (Left )  Patient location during evaluation: PACU Anesthesia Type: General Level of consciousness: awake and alert and oriented Pain management: pain level controlled Vital Signs Assessment: post-procedure vital signs reviewed and stable Respiratory status: spontaneous breathing, nonlabored ventilation and respiratory function stable Cardiovascular status: blood pressure returned to baseline and stable Postop Assessment: no signs of nausea or vomiting Anesthetic complications: no     Last Vitals:  Vitals:   03/06/18 1509 03/06/18 1524  BP: (!) 147/93 138/84  Pulse: 92 85  Resp: 20 17  Temp: 36.5 C   SpO2: 96% 95%    Last Pain:  Vitals:   03/06/18 1524  TempSrc:   PainSc: 0-No pain                 Odean Mcelwain

## 2018-03-06 NOTE — Anesthesia Preprocedure Evaluation (Signed)
Anesthesia Evaluation  Patient identified by MRN, date of birth, ID band Patient awake    Reviewed: Allergy & Precautions, NPO status , Patient's Chart, lab work & pertinent test results  History of Anesthesia Complications Negative for: history of anesthetic complications  Airway Mallampati: II  TM Distance: >3 FB Neck ROM: Full    Dental  (+) Poor Dentition   Pulmonary asthma , sleep apnea (does not have CPAP) ,    breath sounds clear to auscultation- rhonchi (-) wheezing      Cardiovascular Exercise Tolerance: Good (-) hypertension(-) CAD, (-) Past MI, (-) Cardiac Stents and (-) CABG  Rhythm:Regular Rate:Normal - Systolic murmurs and - Diastolic murmurs    Neuro/Psych PSYCHIATRIC DISORDERS Anxiety Depression negative neurological ROS     GI/Hepatic Neg liver ROS, GERD  ,  Endo/Other  negative endocrine ROSneg diabetes  Renal/GU negative Renal ROS     Musculoskeletal negative musculoskeletal ROS (+)   Abdominal (+) + obese,   Peds  Hematology negative hematology ROS (+)   Anesthesia Other Findings Past Medical History: No date: Anxiety No date: Asthma No date: Depression No date: GERD (gastroesophageal reflux disease) No date: History of kidney stones No date: Sleep apnea     Comment:  no CPAP   Reproductive/Obstetrics                             Anesthesia Physical Anesthesia Plan  ASA: II  Anesthesia Plan: General   Post-op Pain Management:  Regional for Post-op pain   Induction: Intravenous  PONV Risk Score and Plan: 1 and Ondansetron, Dexamethasone and Midazolam  Airway Management Planned: Oral ETT  Additional Equipment:   Intra-op Plan:   Post-operative Plan: Extubation in OR  Informed Consent: I have reviewed the patients History and Physical, chart, labs and discussed the procedure including the risks, benefits and alternatives for the proposed anesthesia with  the patient or authorized representative who has indicated his/her understanding and acceptance.   Dental advisory given  Plan Discussed with: CRNA and Anesthesiologist  Anesthesia Plan Comments:         Anesthesia Quick Evaluation

## 2018-03-06 NOTE — Anesthesia Procedure Notes (Signed)
Procedure Name: Intubation Date/Time: 03/06/2018 1:10 PM Performed by: Lowry Bowl, CRNA Pre-anesthesia Checklist: Patient identified, Emergency Drugs available, Suction available and Patient being monitored Patient Re-evaluated:Patient Re-evaluated prior to induction Oxygen Delivery Method: Circle system utilized Preoxygenation: Pre-oxygenation with 100% oxygen Induction Type: IV induction and Cricoid Pressure applied Ventilation: Mask ventilation without difficulty Laryngoscope Size: Mac and 4 Grade View: Grade II Tube type: Oral Tube size: 7.5 mm Number of attempts: 1 Airway Equipment and Method: Stylet Placement Confirmation: ETT inserted through vocal cords under direct vision,  positive ETCO2 and breath sounds checked- equal and bilateral Secured at: 22 cm Tube secured with: Tape Dental Injury: Teeth and Oropharynx as per pre-operative assessment

## 2018-03-06 NOTE — H&P (Signed)
Paper H&P to be scanned into permanent record. H&P reviewed and patient re-examined. No changes. 

## 2018-03-06 NOTE — Transfer of Care (Signed)
Immediate Anesthesia Transfer of Care Note  Patient: Joel Wheeler  Procedure(s) Performed: SHOULDER ARTHROSCOPY WITH OPEN ROTATOR CUFF REPAIR (Left )  Patient Location: PACU  Anesthesia Type:GA combined with regional for post-op pain  Level of Consciousness: awake, oriented and drowsy  Airway & Oxygen Therapy: Patient Spontanous Breathing and Patient connected to face mask oxygen  Post-op Assessment: Report given to RN, Post -op Vital signs reviewed and stable and Patient moving all extremities  Post vital signs: Reviewed and stable  Last Vitals:  Vitals Value Taken Time  BP 147/93 03/06/2018  3:09 PM  Temp 36.5 C 03/06/2018  3:09 PM  Pulse 88 03/06/2018  3:12 PM  Resp 19 03/06/2018  3:12 PM  SpO2 98 % 03/06/2018  3:12 PM  Vitals shown include unvalidated device data.  Last Pain:  Vitals:   03/06/18 1509  TempSrc:   PainSc: 0-No pain         Complications: No apparent anesthesia complications

## 2018-03-06 NOTE — Discharge Instructions (Addendum)
Keep dressing dry and intact.  May shower after dressing changed on post-op day #4 (Monday).  Cover staples with Band-Aids after drying off. Apply ice frequently to shoulder. Take ibuprofen 800 mg TID with meals for 7-10 days, then as necessary. Take oxycodone as prescribed when needed.  May supplement with ES Tylenol if necessary. Keep shoulder immobilizer on at all times except may remove for bathing purposes. Follow-up in 10-14 days or as scheduled.     Interscalene Nerve Block (ISNB) Discharge Instructions   1.  For your surgery you have received an Interscalene Nerve Block. 2. Nerve Blocks affect many types of nerves, including nerves that control movement, pain and normal sensation.  You may experience feelings such as numbness, tingling, heaviness, weakness or the inability to move your arm or the feeling or sensation that your arm has "fallen asleep". 3. A nerve block can last for 2 - 36 hours or more depending on the medication used.  Usually the weakness wears off first.  The tingling and heaviness usually wear off next.  Finally you may start to notice pain.  Keep in mind that this may occur in any order.  once a nerve block starts to wear off it is usually completely gone within 60 minutes. 4. ISNB may cause mild shortness of breath, a hoarse voice, blurry vision, unequal pupils, or drooping of the face on the same side as the nerve block.  These symptoms will usually go away within 12 hours.  Very rarely the procedure itself can cause mild seizures. 5. If needed, your surgeon will give you a prescription for pain medication.  It will take about 60 minutes for the oral pain medication to become fully effective.  So, it is recommended that you start taking this medication before the nerve block first begins to wear off, or when you first begin to feel discomfort. 6. Keep in mind that nerve blocks often wear off in the middle of the night.   If you are going to bed and the block has not  started to wear off or you have not started to have any discomfort, consider setting an alarm for 2 to 3 hours, so you can assess your block.  If you notice the block is wearing off or you are starting to have discomfort, you can take your pain medication. 7. Take your pain medication only as prescribed.  Pain medication can cause sedation and decrease your breathing if you take more than you need for the level of pain that you have. 8. Nausea is a common side effect of many pain medications.  You may want to eat something before taking your pain medicine to prevent nausea. 9. After an Interscalene nerve block, you cannot feel pain, pressure or extremes in temperature in the effected arm.  Because your arm is numb it is at an increased risk for injury.  To decrease the possibility of injury, please practice the following:  a. While you are awake change the position of your arm frequently to prevent too much pressure on any one area for prolonged periods of time. b.  If you have a cast or tight dressing, check the color or your fingers every couple of hours.  Call your surgeon with the appearance of any discoloration (white or blue). c. If you are given a sling to wear before you go home, please wear it  at all times until the block has completely worn off.  Do not get up at night without your  sling. d. If you experience any problems or concerns, please contact your surgeon's office. If you experience severe or prolonged shortness of breath go to the nearest emergency department.  AMBULATORY SURGERY  DISCHARGE INSTRUCTIONS   1) The drugs that you were given will stay in your system until tomorrow so for the next 24 hours you should not:  A) Drive an automobile B) Make any legal decisions C) Drink any alcoholic beverage   2) You may resume regular meals tomorrow.  Today it is better to start with liquids and gradually work up to solid foods.  You may eat anything you prefer, but it is better to  start with liquids, then soup and crackers, and gradually work up to solid foods.   3) Please notify your doctor immediately if you have any unusual bleeding, trouble breathing, redness and pain at the surgery site, drainage, fever, or pain not relieved by medication.    4) Additional Instructions:   Please contact your physician with any problems or Same Day Surgery at [phone], Monday through Friday 6 am to 4 pm, or [place] at [facility] number at [phone].

## 2018-03-06 NOTE — Anesthesia Procedure Notes (Signed)
Anesthesia Regional Block: Interscalene brachial plexus block   Pre-Anesthetic Checklist: ,, timeout performed, Correct Patient, Correct Site, Correct Laterality, Correct Procedure, Correct Position, site marked, Risks and benefits discussed,  Surgical consent,  Pre-op evaluation,  At surgeon's request and post-op pain management  Laterality: Left  Prep: chloraprep       Needles:  Injection technique: Single-shot  Needle Type: Stimiplex     Needle Length: 9cm  Needle Gauge: 21     Additional Needles:   Procedures:,,,, ultrasound used (permanent image in chart),,,,  Narrative:  Start time: 03/06/2018 12:25 PM End time: 03/06/2018 12:34 PM Injection made incrementally with aspirations every 5 mL.  Performed by: Personally   Additional Notes: Functioning IV was confirmed and monitors were applied.  A Stimuplex needle was used. Sterile prep and drape,hand hygiene and sterile gloves were used.  Negative aspiration and negative test dose prior to incremental administration of local anesthetic. The patient tolerated the procedure well.

## 2018-03-06 NOTE — Anesthesia Post-op Follow-up Note (Signed)
Anesthesia QCDR form completed.        

## 2018-03-07 ENCOUNTER — Encounter: Payer: Self-pay | Admitting: Surgery

## 2020-02-09 ENCOUNTER — Other Ambulatory Visit: Payer: Self-pay | Admitting: Surgery

## 2020-02-09 DIAGNOSIS — M25521 Pain in right elbow: Secondary | ICD-10-CM

## 2020-02-09 DIAGNOSIS — M7711 Lateral epicondylitis, right elbow: Secondary | ICD-10-CM

## 2020-02-17 ENCOUNTER — Ambulatory Visit: Admission: RE | Admit: 2020-02-17 | Payer: 59 | Source: Ambulatory Visit

## 2020-07-18 ENCOUNTER — Other Ambulatory Visit: Payer: Self-pay | Admitting: Internal Medicine

## 2020-07-18 DIAGNOSIS — R1010 Upper abdominal pain, unspecified: Secondary | ICD-10-CM

## 2020-07-18 DIAGNOSIS — M6208 Separation of muscle (nontraumatic), other site: Secondary | ICD-10-CM

## 2020-08-02 ENCOUNTER — Ambulatory Visit
Admission: RE | Admit: 2020-08-02 | Discharge: 2020-08-02 | Disposition: A | Discharge status: Home / Self Care | Payer: 59 | Source: Ambulatory Visit | Attending: Internal Medicine | Admitting: Internal Medicine

## 2020-08-02 ENCOUNTER — Other Ambulatory Visit: Payer: Self-pay

## 2020-08-02 DIAGNOSIS — R1010 Upper abdominal pain, unspecified: Secondary | ICD-10-CM | POA: Diagnosis not present

## 2020-08-02 DIAGNOSIS — M6208 Separation of muscle (nontraumatic), other site: Secondary | ICD-10-CM | POA: Diagnosis present

## 2020-08-02 HISTORY — DX: Essential (primary) hypertension: I10

## 2020-08-02 HISTORY — DX: Type 2 diabetes mellitus without complications: E11.9

## 2020-08-02 LAB — POCT I-STAT CREATININE: Creatinine, Ser: 0.8 mg/dL (ref 0.61–1.24)

## 2020-08-02 MED ORDER — IOHEXOL 300 MG/ML  SOLN: 100.0000 mL | Freq: Once | INTRAMUSCULAR | Status: DC | PRN

## 2020-08-02 MED ORDER — IOHEXOL 300 MG/ML  SOLN
125.0000 mL | Freq: Once | INTRAMUSCULAR | Status: AC | PRN
  Administered 2020-08-02: 125 mL via INTRAVENOUS

## 2020-08-22 ENCOUNTER — Other Ambulatory Visit: Payer: Self-pay

## 2020-08-22 ENCOUNTER — Ambulatory Visit
Admission: RE | Admit: 2020-08-22 | Discharge: 2020-08-22 | Disposition: A | Discharge status: Home / Self Care | Payer: 59 | Source: Ambulatory Visit | Attending: Surgery | Admitting: Surgery

## 2020-08-22 DIAGNOSIS — M25521 Pain in right elbow: Secondary | ICD-10-CM | POA: Diagnosis present

## 2020-08-22 DIAGNOSIS — M7711 Lateral epicondylitis, right elbow: Secondary | ICD-10-CM | POA: Insufficient documentation

## 2021-01-27 IMAGING — MR MR ELBOW*R* W/O CM
7 series · 40 of 40 positions shown · non-contrast
Comparison: None.

CLINICAL DATA: Lateral elbow pain and swelling. No injury. History
of prior elbow surgery in the early 2000s.

EXAM:
MRI OF THE RIGHT ELBOW WITHOUT CONTRAST
TECHNIQUE: Multiplanar, multisequence MR imaging of the elbow was performed. No
intravenous contrast was administered.

[Series 3: T1 · axial · right · 3.0mm · 0.47mm/px · z∈[-49,+47]mm · 5 of 25 slices shown (1 of 2)]
[im 1/25]
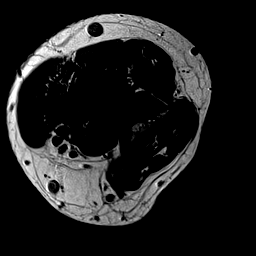
[im 7/25]
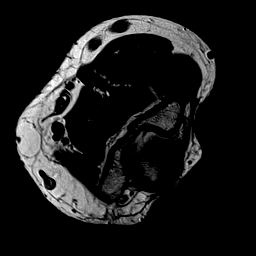
[im 13/25]
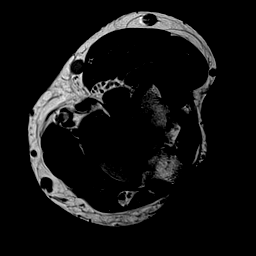
[im 19/25]
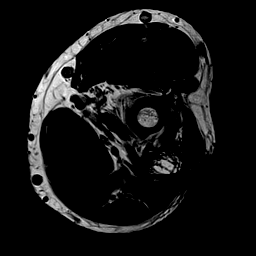
[im 25/25]
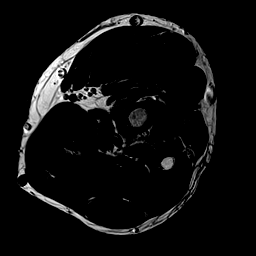

[Series 4: T2 fat-sat · axial · right · 3.0mm · 0.47mm/px · z∈[-49,+47]mm · 6 of 25 slices shown]
[im 1/25]
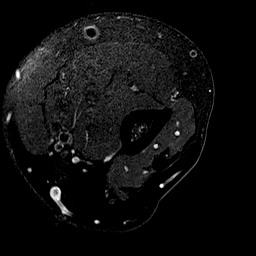
[im 5/25]
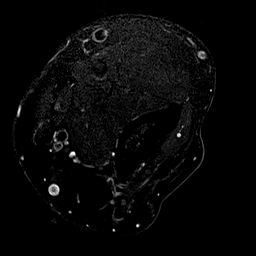
[im 10/25]
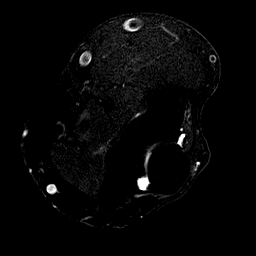
[im 15/25]
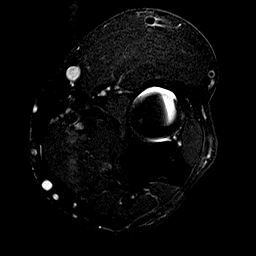
[im 20/25]
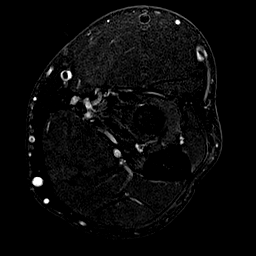
[im 25/25]
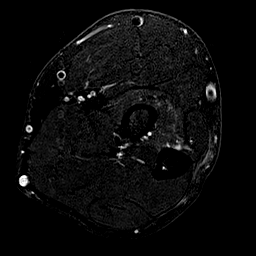

[Series 9: T1 · axial · right · 3.0mm · 0.47mm/px · z∈[-49,+47]mm · 6 of 25 slices shown (2 of 2)]
[im 1/25]
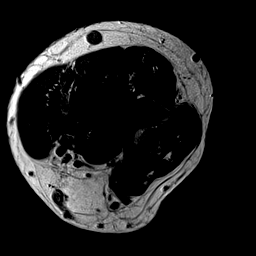
[im 5/25]
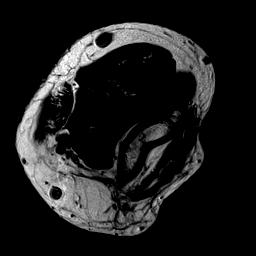
[im 10/25]
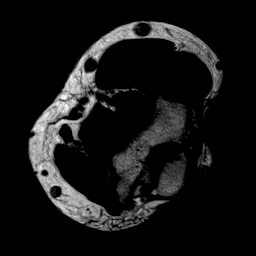
[im 15/25]
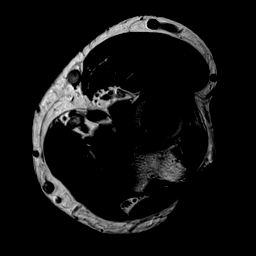
[im 20/25]
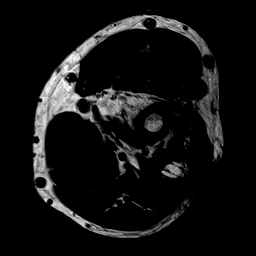
[im 25/25]
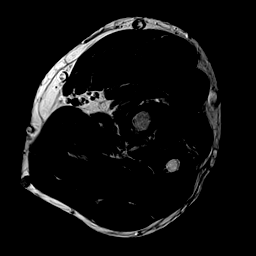

[Series 1009: T2 · sagittal · right · 3.0mm · 0.55mm/px · 5 of 22 slices shown]
[im 1/22]
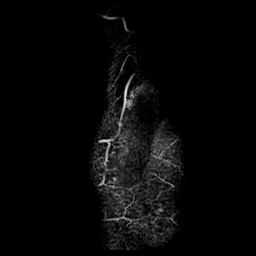
[im 6/22]
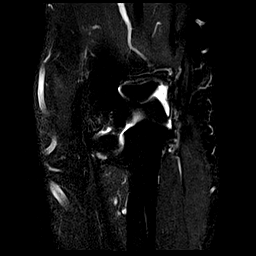
[im 11/22]
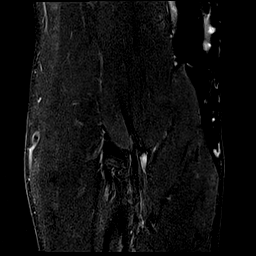
[im 16/22]
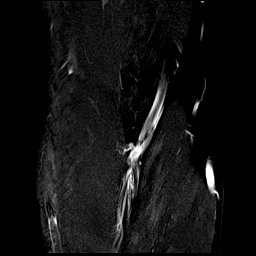
[im 22/22]
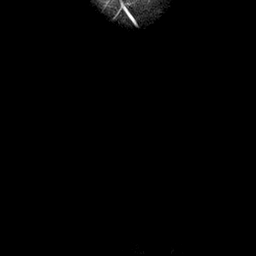

[Series 1016: cor t1_new · sagittal · right · 3.0mm · 0.55mm/px · 6 of 25 slices shown]
[im 1/25]
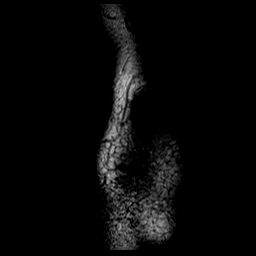
[im 5/25]
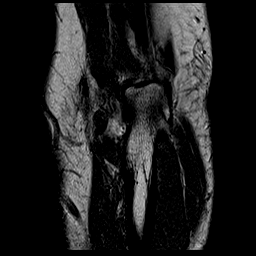
[im 10/25]
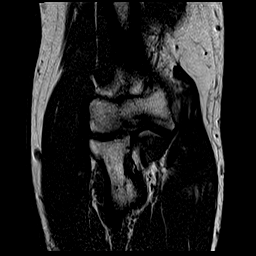
[im 15/25]
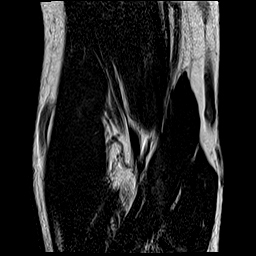
[im 20/25]
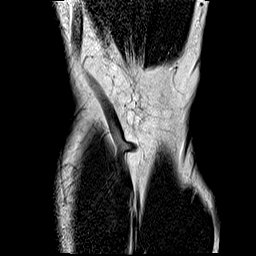
[im 25/25]
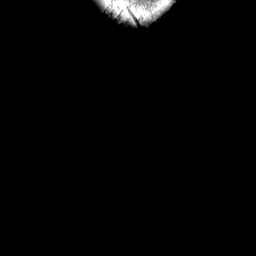

[Series 1024: PD · coronal · right · 3.0mm · 0.55mm/px · 6 of 27 slices shown]
[im 1/27]
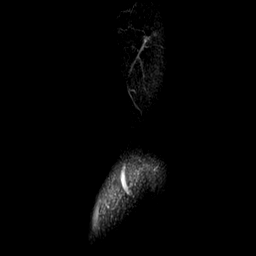
[im 6/27]
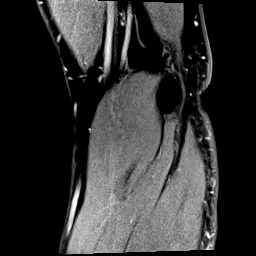
[im 11/27]
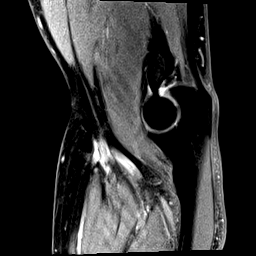
[im 16/27]
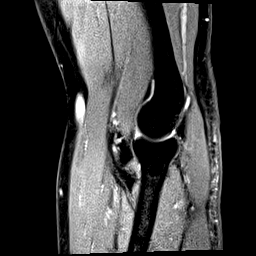
[im 21/27]
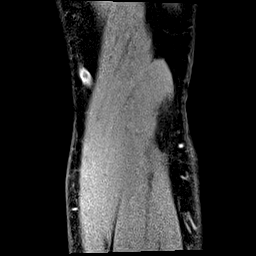
[im 27/27]
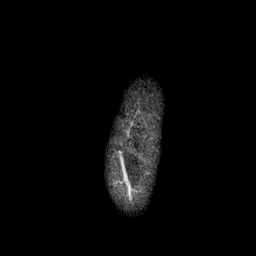

[Series 1032: cor stir_new · sagittal · right · 3.0mm · 0.55mm/px · 6 of 24 slices shown]
[im 1/24]
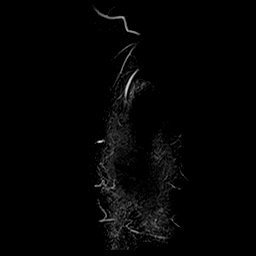
[im 5/24]
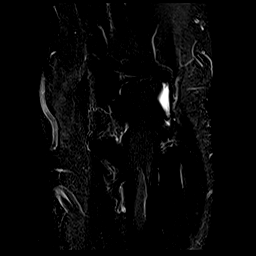
[im 10/24]
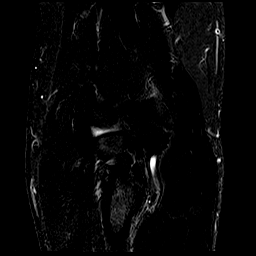
[im 14/24]
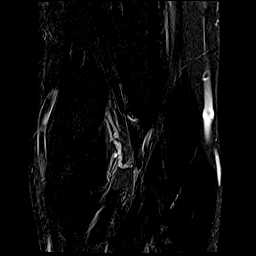
[im 19/24]
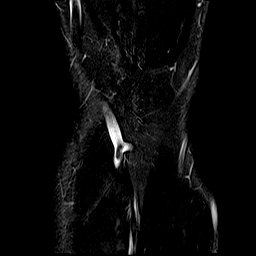
[im 24/24]
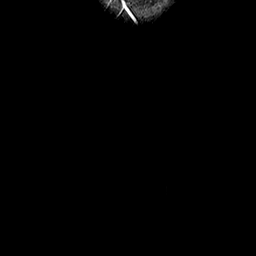

[40 of 40 positions shown; findings below may reference images not displayed]

FINDINGS: TENDONS

Common forearm flexor origin: Intact with normal signal.

Common forearm extensor origin: Intact with normal signal.

Biceps: Intact with mild tendinosis.

Triceps: Intact with normal signal.

LIGAMENTS

Medial stabilizers: Intact.

Lateral stabilizers: The lateral ulnar and radial collateral
ligaments appear intact.

Cartilage: Preserved.  No focal chondral defect demonstrated.

Joint: No joint effusion or loose body observed.

Cubital tunnel: Unremarkable.  The ulnar nerve appears normal.

Bones: Small focus of subchondral marrow edema in the medial aspect
of the radial head, likely degenerative. No acute or significant
extra-articular osseous findings.

Other: None.
IMPRESSION: 1. Mild distal biceps tendinosis.

## 2022-06-27 ENCOUNTER — Other Ambulatory Visit: Payer: Self-pay | Admitting: Internal Medicine

## 2022-06-27 ENCOUNTER — Ambulatory Visit
Admission: RE | Admit: 2022-06-27 | Discharge: 2022-06-27 | Disposition: A | Discharge status: Home / Self Care | Payer: Self-pay | Source: Ambulatory Visit | Attending: Internal Medicine | Admitting: Internal Medicine

## 2022-06-27 DIAGNOSIS — M7989 Other specified soft tissue disorders: Secondary | ICD-10-CM | POA: Insufficient documentation

## 2022-06-27 DIAGNOSIS — I8001 Phlebitis and thrombophlebitis of superficial vessels of right lower extremity: Secondary | ICD-10-CM

## 2022-09-05 ENCOUNTER — Other Ambulatory Visit: Payer: Self-pay | Admitting: Surgery

## 2022-09-12 ENCOUNTER — Encounter
Admission: RE | Admit: 2022-09-12 | Discharge: 2022-09-12 | Disposition: A | Discharge status: Home / Self Care | Payer: Commercial Managed Care - HMO | Source: Ambulatory Visit | Attending: Surgery | Admitting: Surgery

## 2022-09-12 ENCOUNTER — Other Ambulatory Visit: Payer: Self-pay

## 2022-09-12 DIAGNOSIS — Z01812 Encounter for preprocedural laboratory examination: Secondary | ICD-10-CM

## 2022-09-12 DIAGNOSIS — Z01818 Encounter for other preprocedural examination: Secondary | ICD-10-CM | POA: Diagnosis not present

## 2022-09-12 HISTORY — DX: Other specified soft tissue disorders: M79.89

## 2022-09-12 NOTE — Patient Instructions (Addendum)
Your procedure is scheduled on: 09/19/22 - Wednesday Report to the Registration Desk on the 1st floor of the [facility]. To find out your arrival time, please call [phone] between 1PM - 3PM on: 09/18/22 - Tuesday If your arrival time is 6:00 am, do not arrive prior to that time as the [facility] entrance doors do not open until 6:00 am.  REMEMBER: Instructions that are not followed completely may result in serious medical risk, up to and including death; or upon the discretion of your surgeon and anesthesiologist your surgery may need to be rescheduled.  Do not eat food after midnight the night before surgery.  No gum chewing, lozengers or hard candies.  You may however, drink CLEAR liquids up to 2 hours before you are scheduled to arrive for your surgery. Do not drink anything within 2 hours of your scheduled arrival time. Type 1 and Type 2 diabetics should only drink water.  In addition, your doctor has ordered for you to drink the provided  Gatorade G2 Drinking this carbohydrate drink up to two hours before surgery helps to reduce insulin resistance and improve patient outcomes. Please complete drinking 2 hours prior to scheduled arrival time.  TAKE THESE MEDICATIONS THE MORNING OF SURGERY WITH A SIP OF WATER:  - ARIPiprazole (ABILIFY)  - omeprazole (PRILOSEC)    HOLD metFORMIN (GLUCOPHAGE) beginning 09/17/22.    One week prior to surgery: Stop Anti-inflammatories (NSAIDS) such as Advil, Aleve, Ibuprofen, Motrin, Naproxen, Naprosyn and Aspirin based products such as Excedrin, Goodys Powder, BC Powder.    Stop ANY OVER THE COUNTER supplements until after surgery.  You may however, continue to take Tylenol if needed for pain up until the day of surgery.  No Alcohol for 24 hours before or after surgery.  No Smoking including e-cigarettes for 24 hours prior to surgery.  No chewable tobacco products for at least 6 hours prior to surgery.  No nicotine patches on the day  of surgery.  Do not use any "recreational" drugs for at least a week prior to your surgery.  Please be advised that the combination of cocaine and anesthesia may have negative outcomes, up to and including death. If you test positive for cocaine, your surgery will be cancelled.  On the morning of surgery brush your teeth with toothpaste and water, you may rinse your mouth with mouthwash if you wish. Do not swallow any toothpaste or mouthwash.  Use CHG Soap or wipes as directed on instruction sheet.  Do not wear jewelry, make-up, hairpins, clips or nail polish.  Do not wear lotions, powders, or perfumes.   Do not shave body from the neck down 48 hours prior to surgery just in case you cut yourself which could leave a site for infection.  Also, freshly shaved skin may become irritated if using the CHG soap.  Contact lenses, hearing aids and dentures may not be worn into surgery.  Do not bring valuables to the hospital. [facility] is not responsible for any missing/lost belongings or valuables.   Notify your doctor if there is any change in your medical condition (cold, fever, infection).  Wear comfortable clothing (specific to your surgery type) to the hospital.  After surgery, you can help prevent lung complications by doing breathing exercises.  Take deep breaths and cough every 1-2 hours. Your doctor may order a device called an Incentive Spirometer to help you take deep breaths. When coughing or sneezing, hold a pillow firmly against your incision with both hands.  This is called "splinting." Doing this helps protect your incision. It also decreases belly discomfort.  If you are being admitted to the hospital overnight, leave your suitcase in the car. After surgery it may be brought to your room.  If you are being discharged the day of surgery, you will not be allowed to drive home. You will need a responsible adult (18 years or older) to drive you home and stay with you that  night.   If you are taking public transportation, you will need to have a responsible adult (18 years or older) with you. Please confirm with your physician that it is acceptable to use public transportation.   Please call the [facility] Dept[facility] at [phone] if you have any questions about these instructions.  Surgery Visitation Policy:  Patients undergoing a surgery or procedure may have two family members or support persons with them as long as the person is not COVID-19 positive or experiencing its symptoms.   Inpatient Visitation:    Visiting hours are 7 a.m. to 8 p.m. Up to four visitors are allowed at one time in a patient room, including children. The visitors may rotate out with other people during the day. One designated support person (adult) may remain overnight.

## 2022-09-19 ENCOUNTER — Ambulatory Visit: Payer: Commercial Managed Care - HMO | Admitting: Urgent Care

## 2022-09-19 ENCOUNTER — Encounter: Payer: Self-pay | Admitting: Surgery

## 2022-09-19 ENCOUNTER — Other Ambulatory Visit: Payer: Self-pay

## 2022-09-19 ENCOUNTER — Encounter
Admission: RE | Disposition: A | Discharge status: Home / Self Care | Payer: Self-pay | Source: Home / Self Care | Attending: Surgery

## 2022-09-19 ENCOUNTER — Ambulatory Visit
Admission: RE | Admit: 2022-09-19 | Discharge: 2022-09-19 | Disposition: A | Discharge status: Home / Self Care | Payer: Commercial Managed Care - HMO | Attending: Surgery | Admitting: Surgery

## 2022-09-19 ENCOUNTER — Ambulatory Visit: Payer: Commercial Managed Care - HMO | Admitting: Anesthesiology

## 2022-09-19 DIAGNOSIS — F418 Other specified anxiety disorders: Secondary | ICD-10-CM | POA: Insufficient documentation

## 2022-09-19 DIAGNOSIS — Z6837 Body mass index (BMI) 37.0-37.9, adult: Secondary | ICD-10-CM | POA: Diagnosis not present

## 2022-09-19 DIAGNOSIS — E119 Type 2 diabetes mellitus without complications: Secondary | ICD-10-CM | POA: Diagnosis not present

## 2022-09-19 DIAGNOSIS — I10 Essential (primary) hypertension: Secondary | ICD-10-CM | POA: Insufficient documentation

## 2022-09-19 DIAGNOSIS — E669 Obesity, unspecified: Secondary | ICD-10-CM | POA: Diagnosis not present

## 2022-09-19 DIAGNOSIS — D1721 Benign lipomatous neoplasm of skin and subcutaneous tissue of right arm: Secondary | ICD-10-CM | POA: Insufficient documentation

## 2022-09-19 DIAGNOSIS — J45909 Unspecified asthma, uncomplicated: Secondary | ICD-10-CM | POA: Insufficient documentation

## 2022-09-19 DIAGNOSIS — G4730 Sleep apnea, unspecified: Secondary | ICD-10-CM | POA: Diagnosis not present

## 2022-09-19 DIAGNOSIS — Z01812 Encounter for preprocedural laboratory examination: Secondary | ICD-10-CM

## 2022-09-19 HISTORY — PX: EXCISION MASS UPPER EXTREMETIES: SHX6704

## 2022-09-19 LAB — GLUCOSE, CAPILLARY
Glucose-Capillary: 166 mg/dL — ABNORMAL HIGH (ref 70–99)
Glucose-Capillary: 163 mg/dL — ABNORMAL HIGH (ref 70–99)

## 2022-09-19 SURGERY — EXCISION MASS UPPER EXTREMITIES
Anesthesia: General | Site: Arm Upper | Laterality: Right

## 2022-09-19 MED ORDER — CEFAZOLIN SODIUM-DEXTROSE 2-4 GM/100ML-% IV SOLN
INTRAVENOUS | Status: AC
  Filled 2022-09-19: qty 100

## 2022-09-19 MED ORDER — METOCLOPRAMIDE HCL 5 MG/ML IJ SOLN: 5.0000 mg | Freq: Three times a day (TID) | INTRAMUSCULAR | Status: DC | PRN

## 2022-09-19 MED ORDER — HYDROCODONE-ACETAMINOPHEN 7.5-325 MG PO TABS: 1.0000 | ORAL_TABLET | ORAL | Status: DC | PRN

## 2022-09-19 MED ORDER — CHLORHEXIDINE GLUCONATE 0.12 % MT SOLN
OROMUCOSAL | Status: AC
  Administered 2022-09-19: 15 mL via OROMUCOSAL
  Filled 2022-09-19: qty 15

## 2022-09-19 MED ORDER — ONDANSETRON HCL 4 MG/2ML IJ SOLN: 4.0000 mg | Freq: Once | INTRAMUSCULAR | Status: DC | PRN

## 2022-09-19 MED ORDER — MIDAZOLAM HCL 2 MG/2ML IJ SOLN
INTRAMUSCULAR | Status: AC
  Filled 2022-09-19: qty 2

## 2022-09-19 MED ORDER — ONDANSETRON HCL 4 MG PO TABS: 4.0000 mg | ORAL_TABLET | Freq: Four times a day (QID) | ORAL | Status: DC | PRN

## 2022-09-19 MED ORDER — NEOMYCIN-POLYMYXIN B GU 40-200000 IR SOLN
Status: AC
  Filled 2022-09-19: qty 20

## 2022-09-19 MED ORDER — CHLORHEXIDINE GLUCONATE 0.12 % MT SOLN: 15.0000 mL | Freq: Once | OROMUCOSAL | Status: AC

## 2022-09-19 MED ORDER — DEXAMETHASONE SODIUM PHOSPHATE 10 MG/ML IJ SOLN
INTRAMUSCULAR | Status: DC | PRN
  Administered 2022-09-19: 10 mg via INTRAVENOUS

## 2022-09-19 MED ORDER — FENTANYL CITRATE (PF) 100 MCG/2ML IJ SOLN
INTRAMUSCULAR | Status: DC | PRN
  Administered 2022-09-19 (×2): 50 ug via INTRAVENOUS

## 2022-09-19 MED ORDER — ONDANSETRON HCL 4 MG/2ML IJ SOLN
INTRAMUSCULAR | Status: DC | PRN
  Administered 2022-09-19: 4 mg via INTRAVENOUS

## 2022-09-19 MED ORDER — CEFAZOLIN SODIUM-DEXTROSE 2-4 GM/100ML-% IV SOLN
2.0000 g | INTRAVENOUS | Status: AC
  Administered 2022-09-19: 2 g via INTRAVENOUS

## 2022-09-19 MED ORDER — PROPOFOL 10 MG/ML IV BOLUS
INTRAVENOUS | Status: AC
  Filled 2022-09-19: qty 20

## 2022-09-19 MED ORDER — BUPIVACAINE HCL (PF) 0.5 % IJ SOLN
INTRAMUSCULAR | Status: AC
  Filled 2022-09-19: qty 30

## 2022-09-19 MED ORDER — MIDAZOLAM HCL 2 MG/2ML IJ SOLN
INTRAMUSCULAR | Status: DC | PRN
  Administered 2022-09-19: 2 mg via INTRAVENOUS

## 2022-09-19 MED ORDER — 0.9 % SODIUM CHLORIDE (POUR BTL) OPTIME
TOPICAL | Status: DC | PRN
  Administered 2022-09-19: 500 mL

## 2022-09-19 MED ORDER — ONDANSETRON HCL 4 MG/2ML IJ SOLN: 4.0000 mg | Freq: Four times a day (QID) | INTRAMUSCULAR | Status: DC | PRN

## 2022-09-19 MED ORDER — LIDOCAINE HCL (CARDIAC) PF 100 MG/5ML IV SOSY
PREFILLED_SYRINGE | INTRAVENOUS | Status: DC | PRN
  Administered 2022-09-19: 100 mg via INTRAVENOUS

## 2022-09-19 MED ORDER — FENTANYL CITRATE (PF) 100 MCG/2ML IJ SOLN
INTRAMUSCULAR | Status: AC
  Filled 2022-09-19: qty 2

## 2022-09-19 MED ORDER — ROCURONIUM BROMIDE 100 MG/10ML IV SOLN
INTRAVENOUS | Status: DC | PRN
  Administered 2022-09-19: 50 mg via INTRAVENOUS

## 2022-09-19 MED ORDER — ACETAMINOPHEN 10 MG/ML IV SOLN
INTRAVENOUS | Status: AC
  Filled 2022-09-19: qty 100

## 2022-09-19 MED ORDER — PROPOFOL 10 MG/ML IV BOLUS
INTRAVENOUS | Status: DC | PRN
  Administered 2022-09-19: 200 mg via INTRAVENOUS
  Administered 2022-09-19: 100 mg via INTRAVENOUS

## 2022-09-19 MED ORDER — SODIUM CHLORIDE 0.9 % IV SOLN: INTRAVENOUS | Status: DC

## 2022-09-19 MED ORDER — METOCLOPRAMIDE HCL 10 MG PO TABS: 5.0000 mg | ORAL_TABLET | Freq: Three times a day (TID) | ORAL | Status: DC | PRN

## 2022-09-19 MED ORDER — FENTANYL CITRATE (PF) 100 MCG/2ML IJ SOLN: 25.0000 ug | INTRAMUSCULAR | Status: DC | PRN

## 2022-09-19 MED ORDER — ROCURONIUM BROMIDE 10 MG/ML (PF) SYRINGE
PREFILLED_SYRINGE | INTRAVENOUS | Status: AC
  Filled 2022-09-19: qty 10

## 2022-09-19 MED ORDER — ONDANSETRON HCL 4 MG/2ML IJ SOLN
INTRAMUSCULAR | Status: AC
  Filled 2022-09-19: qty 2

## 2022-09-19 MED ORDER — ACETAMINOPHEN 10 MG/ML IV SOLN
INTRAVENOUS | Status: DC | PRN
  Administered 2022-09-19: 1000 mg via INTRAVENOUS

## 2022-09-19 MED ORDER — OXYCODONE HCL 5 MG/5ML PO SOLN: 5.0000 mg | Freq: Once | ORAL | Status: DC | PRN

## 2022-09-19 MED ORDER — ORAL CARE MOUTH RINSE: 15.0000 mL | Freq: Once | OROMUCOSAL | Status: AC

## 2022-09-19 MED ORDER — SUGAMMADEX SODIUM 500 MG/5ML IV SOLN
INTRAVENOUS | Status: DC | PRN
  Administered 2022-09-19: 200 mg via INTRAVENOUS

## 2022-09-19 MED ORDER — ACETAMINOPHEN 10 MG/ML IV SOLN: 1000.0000 mg | Freq: Once | INTRAVENOUS | Status: DC | PRN

## 2022-09-19 MED ORDER — LACTATED RINGERS IV SOLN: INTRAVENOUS | Status: DC

## 2022-09-19 MED ORDER — OXYCODONE HCL 5 MG PO TABS: 5.0000 mg | ORAL_TABLET | Freq: Once | ORAL | Status: DC | PRN

## 2022-09-19 MED ORDER — DEXAMETHASONE SODIUM PHOSPHATE 10 MG/ML IJ SOLN
INTRAMUSCULAR | Status: AC
  Filled 2022-09-19: qty 1

## 2022-09-19 MED ORDER — LIDOCAINE HCL (PF) 2 % IJ SOLN
INTRAMUSCULAR | Status: AC
  Filled 2022-09-19: qty 5

## 2022-09-19 MED ORDER — BUPIVACAINE HCL (PF) 0.5 % IJ SOLN
INTRAMUSCULAR | Status: DC | PRN
  Administered 2022-09-19: 10 mL

## 2022-09-19 MED ORDER — DEXMEDETOMIDINE HCL IN NACL 80 MCG/20ML IV SOLN
INTRAVENOUS | Status: DC | PRN
  Administered 2022-09-19: 12 ug via BUCCAL

## 2022-09-19 SURGICAL SUPPLY — 31 items
BENZOIN TINCTURE PRP APPL 2/3 (GAUZE/BANDAGES/DRESSINGS) IMPLANT
BNDG COHESIVE 4X5 TAN STRL LF (GAUZE/BANDAGES/DRESSINGS) ×1 IMPLANT
BNDG ELASTIC 4X5.8 VLCR NS LF (GAUZE/BANDAGES/DRESSINGS) ×1 IMPLANT
BNDG ESMARK 4X12 TAN STRL LF (GAUZE/BANDAGES/DRESSINGS) ×1 IMPLANT
CHLORAPREP W/TINT 26 (MISCELLANEOUS) ×1 IMPLANT
CORD BIP STRL DISP 12FT (MISCELLANEOUS) ×1 IMPLANT
DRAPE SURG 17X11 SM STRL (DRAPES) ×1 IMPLANT
DRSG OPSITE POSTOP 3X4 (GAUZE/BANDAGES/DRESSINGS) IMPLANT
ELECT REM PT RETURN 9FT ADLT (ELECTROSURGICAL) ×1
ELECTRODE REM PT RTRN 9FT ADLT (ELECTROSURGICAL) ×1 IMPLANT
FORCEPS JEWEL BIP 4-3/4 STR (INSTRUMENTS) ×1 IMPLANT
GAUZE SPONGE 4X4 12PLY STRL (GAUZE/BANDAGES/DRESSINGS) ×1 IMPLANT
GLOVE BIO SURGEON STRL SZ8 (GLOVE) ×1 IMPLANT
GLOVE SURG UNDER LTX SZ8 (GLOVE) ×1 IMPLANT
GOWN STRL REUS W/ TWL LRG LVL3 (GOWN DISPOSABLE) ×1 IMPLANT
GOWN STRL REUS W/ TWL XL LVL3 (GOWN DISPOSABLE) ×1 IMPLANT
GOWN STRL REUS W/TWL LRG LVL3 (GOWN DISPOSABLE) ×2
GOWN STRL REUS W/TWL XL LVL3 (GOWN DISPOSABLE) ×3
KIT TURNOVER KIT A (KITS) ×1 IMPLANT
MANIFOLD NEPTUNE II (INSTRUMENTS) ×1 IMPLANT
NS IRRIG 500ML POUR BTL (IV SOLUTION) ×1 IMPLANT
PACK EXTREMITY ARMC (MISCELLANEOUS) ×1 IMPLANT
PAD CAST 4YDX4 CTTN HI CHSV (CAST SUPPLIES) ×1 IMPLANT
PAD PREP 24X41 OB/GYN DISP (PERSONAL CARE ITEMS) ×1 IMPLANT
PADDING CAST COTTON 4X4 STRL (CAST SUPPLIES) ×1
STOCKINETTE IMPERV 14X48 (MISCELLANEOUS) ×1 IMPLANT
STRIP CLOSURE SKIN 1/2X4 (GAUZE/BANDAGES/DRESSINGS) ×1 IMPLANT
SUT VIC AB 2-0 SH 27 (SUTURE) ×1
SUT VIC AB 2-0 SH 27XBRD (SUTURE) ×1 IMPLANT
SUT VIC AB 3-0 PS2 18 (SUTURE) ×1 IMPLANT
TRAP FLUID SMOKE EVACUATOR (MISCELLANEOUS) ×1 IMPLANT

## 2022-09-19 NOTE — Op Note (Signed)
09/19/2022  12:28 PM  Patient:   Joel Wheeler  Pre-Op Diagnosis:   Multiple subcutaneous lipomas, right upper extremity.  Post-Op Diagnosis:   Same  Procedure:   Excision of subcutaneous lipomas x5, right upper extremity.  Surgeon:   Pascal Lux, MD  Assistant:   Reymundo Poll, PA-S  Anesthesia:   GET  Findings:   As above.  Complications:   None  Fluids:   600 cc crystalloid  EBL:   2 cc  UOP:   None  TT:   42 minutes at 250 mmHg  Drains:   None  Closure:   2-0 Vicryl subcuticular sutures  Brief Clinical Note:   The patient is a 56 year old male with a history of multiple subcutaneous lipomas over his entire body.  He notes that there is 1 in particular over the antecubital fossa in the area of where he gets frequent blood draws which is quite uncomfortable and requested it be removed.  He also would like to have several additional lipomas removed in the same extremity at this time.  Therefore, the patient presents at this time for excision of subcutaneous lipomas x5 of his right upper extremity.  Procedure:   The patient was brought into the operating room and lain in the supine position.  After adequate general endotracheal intubation and anesthesia were obtained, the patient's right upper extremity was prepped with ChloraPrep solution before being draped sterilely.  Preoperative antibiotics were administered.  A timeout was performed to verify the appropriate surgical site before a sterile tourniquet was applied.  The arm was then exsanguinated with an Esmarch and the tourniquet inflated to 250 mmHg.  Each lipoma was removed sequentially, beginning with the most proximal lipoma in the medial aspect of his upper arm then progressing distally along the right upper extremity and forearm.  For each lipoma, an incision was made centered over the lipoma.  The incision was carried down through the subcutaneous tissues to expose the lipoma.  This was then dissected  out circumferentially with care taken to protect any surrounding neurovascular structures before being removed in its entirety.  Each lipoma was sent off to pathology as a precaution.  The lipomas were numbered sequentially from 1-5 beginning with the most proximal excised lipoma and extending to the most distal excised lipoma.  The most distal site in the ulnar forearm region contained two adjacent lipomas, so both were excised and sent in the same specimen container.  Each wound was then copiously irrigated with sterile saline solution before being closed using 2-0 Vicryl subcuticular sutures.  Benzoin and Steri-Strips were applied to the skin in each case before sterile occlusive dressings were applied to each wound.  Prior to closure, a total of 10 cc of 0.5% plain Sensorcaine was injected in and around each of the incisions to help with postoperative analgesia.  The patient was then awakened, extubated, and returned to the recovery room in satisfactory condition after tolerating the procedure well.

## 2022-09-19 NOTE — Anesthesia Postprocedure Evaluation (Signed)
Anesthesia Post Note  Patient: Joel Wheeler  Procedure(s) Performed: EXCISION OF SOFT TISSUE MASS, RIGHT TIMES 4, UPPER EXTREMITIES (Right: Arm Upper)  Patient location during evaluation: PACU Anesthesia Type: General Level of consciousness: awake and alert, oriented and patient cooperative Pain management: pain level controlled Vital Signs Assessment: post-procedure vital signs reviewed and stable Respiratory status: spontaneous breathing, nonlabored ventilation and respiratory function stable Cardiovascular status: blood pressure returned to baseline and stable Postop Assessment: adequate PO intake Anesthetic complications: no   No notable events documented.   Last Vitals:  Vitals:   09/19/22 1245 09/19/22 1300  BP: 116/70 124/73  Pulse: 85 79  Resp: 20 19  Temp:    SpO2: 98% 99%    Last Pain:  Vitals:   09/19/22 1300  TempSrc:   PainSc: Asleep                 Darrin Nipper

## 2022-09-19 NOTE — H&P (Signed)
History of Present Illness: Joel Wheeler is a 56 y.o. who presents today for history and physical. He is to undergo excision of lipomas of the right upper extremity on 09/19/2022. Since his last visit he has seen no change in his condition. He wishes to proceed with surgery.  The patient has a history of multiple subcutaneous lipomas. He finds that these lipomas are beginning to cause him discomfort and interfering with certain activities such as getting blood drawn. He would like to have them removed. He denies any restriction in range of motion of his shoulder, elbow, wrist, or hand, nor does he note any numbness or paresthesias down his arm to his hand.   Past Medical History: Anxiety state  Asthma without status asthmaticus  Depression (emotion)  Essential hypertension 06/20/2021  GERD (gastroesophageal reflux disease)  Right lateral epicondylitis 02/08/2020  Sleep apnea  Type 2 diabetes mellitus with hyperglycemia, without long-term current use of insulin (CMS-HCC) 07/04/2020   Past Surgical History: HERNIA REPAIR 1988  Endoscopic left carpal tunnel release. Left 11/06/2017 (Dr. Roland Rack)  Limited arthroscopic debridement, arthroscopic subacromial decompression, mini-open rotator cuff repair, and mini-open biceps tenodesis, left shoulder. Left 03/06/2018 (Dr. Roland Rack)  right arm  UMBILICAL HERNIA REPAIR   Past Family History: Depression Mother   Medications: acetaminophen (TYLENOL) 500 MG tablet Take by mouth Take 1,000 mg by mouth every 8 (eight) hours as needed for mild pain or moderate pain.  losartan (COZAAR) 100 MG tablet TAKE 1 TABLET BY MOUTH EVERY DAY 30 tablet 5  metFORMIN (GLUCOPHAGE) 500 MG tablet TAKE 1 TABLET BY MOUTH TWICE A DAY WITH MEALS 180 tablet 1  multivitamin tablet Take 1 tablet by mouth once daily  omeprazole (PRILOSEC) 20 MG DR capsule TAKE 1 CAPSULE BY MOUTH TWICE A DAY BEFORE MEALS 180 capsule 1  oxyCODONE-acetaminophen (PERCOCET) 7.5-325 mg tablet Take 1  tablet by mouth 2 (two) times daily as needed for Pain for up to 30 days 60 tablet 0  rosuvastatin (CRESTOR) 20 MG tablet Take 1 tablet (20 mg total) by mouth once daily 90 tablet 1  semaglutide (OZEMPIC) 0.25 mg or 0.5 mg(2 mg/1.5 mL) pen injector Inject 0.375 mLs (0.5 mg total) subcutaneously once a week for 90 days 1.5 mL 5  traZODone (DESYREL) 50 MG tablet Take 1 tablet (50 mg total) by mouth at bedtime (Patient taking differently: Take 100 mg by mouth at bedtime) 90 tablet 1  albuterol 90 mcg/actuation inhaler Inhale 1 inhalation into the lungs as needed for up to 180 days 1 each 5   Allergies: No Known Allergies   Review of Systems: A comprehensive 14 point ROS was performed, reviewed, and the pertinent orthopaedic findings are documented in the HPI.  Physical Exam: BP 134/84  Ht 170.2 cm ('5\' 7"'$ )  Wt (!) 112.9 kg (249 lb)  BMI 39.00 kg/m   General: Well-developed well-nourished male seen in no acute distress.   HEENT: Atraumatic,normocephalic. Pupils are equal and reactive to light. Oropharynx is clear with moist mucosa  Lungs: Clear to auscultation bilaterally   Cardiovascular: Regular rate and rhythm. Normal S1, S2. No murmurs. No appreciable gallops or rubs. Peripheral pulses are palpable.  Abdomen: Soft, non-tender, nondistended. Bowel sounds present  Right upper extremity exam: Skin inspection of the right upper extremity is notable for multiple firm mobile subcutaneous soft tissue masses along the medial aspect of his upper arm, the antecubital fossa, and along the ulnar aspect of his mid forearm. These masses are nontender to palpation and do not appear  to be fixed to more deeper structures. He exhibits full active and passive range of motion of his right shoulder, right elbow, right wrist, and hand without any pain or catching. He is neurovascularly intact to the right upper extremity and hand.  Neurological: The patient is alert and oriented Sensation to light  touch appears to be intact and within normal limits Gross motor strength appeared to be equal to 5/5  Vascular : Peripheral pulses felt to be palpable. Capillary refill appears to be intact and within normal limits  X-ray: A recent MRI scan of the right elbow is available for review. By report, the study demonstrates no evidence for deeper muscle or tendinous involvement, other than some mild tendinopathy of the distal biceps tendon. There is some degenerative changes of the radiocapitellar joint, but otherwise no bony abnormalities are noted. Both the films and report were reviewed by myself and discussed with the patient.   Impression: 1.  Multiple subcutaneous lipomas right upper extremity.  Plan:  The treatment options were discussed with the patient. In addition, patient educational materials were provided regarding the diagnosis and treatment options. The patient is frustrated by presence of these masses and would like to have them removed. Therefore, I have recommended a surgical procedure, specifically an excision of 5 of the masses along the ulnar aspect of his right upper arm and forearm. The procedure was discussed with the patient, as were the potential risks (including bleeding, infection, nerve and/or blood vessel injury, persistent or recurrent pain, recurrence of the masses, need for further surgery, blood clots, strokes, heart attacks and/or arhythmias, pneumonia, etc.) and benefits. The patient states his understanding and wishes to proceed. All of the patient's questions and concerns were answered. He can call any time with further concerns. He will follow up post-surgery, routine.     H&P reviewed and patient re-examined. No changes.

## 2022-09-19 NOTE — Anesthesia Preprocedure Evaluation (Addendum)
Anesthesia Evaluation  Patient identified by MRN, date of birth, ID band Patient awake    Reviewed: Allergy & Precautions, NPO status , Patient's Chart, lab work & pertinent test results  History of Anesthesia Complications Negative for: history of anesthetic complications  Airway Mallampati: III   Neck ROM: Full    Dental  (+) Missing   Pulmonary asthma , sleep apnea ,    Pulmonary exam normal breath sounds clear to auscultation       Cardiovascular hypertension, Normal cardiovascular exam Rhythm:Regular Rate:Normal  ECG 09/12/22: normal   Neuro/Psych PSYCHIATRIC DISORDERS Anxiety Depression negative neurological ROS     GI/Hepatic GERD  ,  Endo/Other  diabetes, Type 2Obesity   Renal/GU Renal disease (nephrolithiasis)     Musculoskeletal   Abdominal   Peds  Hematology negative hematology ROS (+)   Anesthesia Other Findings   Reproductive/Obstetrics                            Anesthesia Physical Anesthesia Plan  ASA: 2  Anesthesia Plan: General   Post-op Pain Management:    Induction: Intravenous  PONV Risk Score and Plan: 2 and Ondansetron, Dexamethasone and Treatment may vary due to age or medical condition  Airway Management Planned: Oral ETT  Additional Equipment:   Intra-op Plan:   Post-operative Plan: Extubation in OR  Informed Consent: I have reviewed the patients History and Physical, chart, labs and discussed the procedure including the risks, benefits and alternatives for the proposed anesthesia with the patient or authorized representative who has indicated his/her understanding and acceptance.     Dental advisory given  Plan Discussed with: CRNA  Anesthesia Plan Comments: (Patient consented for risks of anesthesia including but not limited to:  - adverse reactions to medications - damage to eyes, teeth, lips or other oral mucosa - nerve damage due to  positioning  - sore throat or hoarseness - damage to heart, brain, nerves, lungs, other parts of body or loss of life  Informed patient about role of CRNA in peri- and intra-operative care.  Patient voiced understanding.)       Anesthesia Quick Evaluation

## 2022-09-19 NOTE — Discharge Instructions (Addendum)
Orthopedic discharge instructions: Keep dressing dry and intact. Keep hand elevated above heart level. May shower after dressings removed on postop day 4 (Sunday). Cover sutures with Band-Aids after drying off. Apply ice to affected area frequently. Take ibuprofen 600-800 mg TID with meals for 7-10 days, then as necessary. Take ES Tylenol or pain medication as prescribed when needed.  Return for follow-up in 10-14 days or as scheduled.   AMBULATORY SURGERY  DISCHARGE INSTRUCTIONS   The drugs that you were given will stay in your system until tomorrow so for the next 24 hours you should not:  Drive an automobile Make any legal decisions Drink any alcoholic beverage   You may resume regular meals tomorrow.  Today it is better to start with liquids and gradually work up to solid foods.  You may eat anything you prefer, but it is better to start with liquids, then soup and crackers, and gradually work up to solid foods.   Please notify your doctor immediately if you have any unusual bleeding, trouble breathing, redness and pain at the surgery site, drainage, fever, or pain not relieved by medication.      Additional Instructions:

## 2022-09-19 NOTE — Transfer of Care (Signed)
Immediate Anesthesia Transfer of Care Note  Patient: Joel Wheeler  Procedure(s) Performed: EXCISION OF SOFT TISSUE MASS, RIGHT TIMES 4, UPPER EXTREMITIES (Right: Arm Upper)  Patient Location: PACU  Anesthesia Type:General  Level of Consciousness: drowsy  Airway & Oxygen Therapy: Patient Spontanous Breathing and Patient connected to face mask oxygen  Post-op Assessment: Report given to RN and Post -op Vital signs reviewed and stable  Post vital signs: Reviewed and stable  Last Vitals:  Vitals Value Taken Time  BP 119/75 09/19/22 1234  Temp    Pulse 80 09/19/22 1237  Resp 22 09/19/22 1237  SpO2 97 % 09/19/22 1237  Vitals shown include unvalidated device data.  Last Pain:  Vitals:   09/19/22 0936  TempSrc: Tympanic  PainSc: 0-No pain         Complications: No notable events documented.

## 2022-09-19 NOTE — Anesthesia Procedure Notes (Signed)
Procedure Name: Intubation Date/Time: 09/19/2022 11:14 AM  Performed by: Aline Brochure, CRNAPre-anesthesia Checklist: Patient identified, Patient being monitored, Timeout performed, Emergency Drugs available and Suction available Patient Re-evaluated:Patient Re-evaluated prior to induction Oxygen Delivery Method: Circle system utilized Preoxygenation: Pre-oxygenation with 100% oxygen Induction Type: IV induction Ventilation: Mask ventilation without difficulty Laryngoscope Size: Mac and 4 Grade View: Grade I Tube type: Oral Tube size: 7.5 mm Number of attempts: 1 Airway Equipment and Method: Stylet Placement Confirmation: ETT inserted through vocal cords under direct vision, positive ETCO2 and breath sounds checked- equal and bilateral Secured at: 21 cm Tube secured with: Tape Dental Injury: Teeth and Oropharynx as per pre-operative assessment

## 2022-09-20 ENCOUNTER — Encounter: Payer: Self-pay | Admitting: Surgery

## 2022-09-20 LAB — SURGICAL PATHOLOGY

## 2022-12-09 NOTE — H&P (Incomplete)
Pre-Procedure H&P   Patient ID: Joel Wheeler is a 57 y.o. male.  Gastroenterology Provider: Annamaria Helling, DO  Referring Provider: Dr. Ginette Pitman PCP: Tracie Harrier, MD  Date: 12/09/2022  HPI Joel Wheeler is a 57 y.o. male who presents today for Colonoscopy for Initial screening colonoscopy .  Patient undergoing his initial screening colonoscopy.  He is on Ozempic which has been held for 1 week prior to procedure***  No family history of colon cancer or colon polyps.  The patient has a daily bowel meant without melena or hematochezia.  Most recent lab work A1c 6.6 creatinine 0.8 hemoglobin 13.6 MCV 83.6 platelets 251,000   Past Medical History:  Diagnosis Date   Anxiety    Asthma    Depression    Diabetes mellitus without complication (HCC)    GERD (gastroesophageal reflux disease)    History of kidney stones    Hypertension    Sleep apnea    no CPAP   Soft tissue mass     Past Surgical History:  Procedure Laterality Date   bone spur right shoulder Right    CARPAL TUNNEL RELEASE Left 11/06/2017   Procedure: CARPAL TUNNEL RELEASE;  Surgeon: Corky Mull, MD;  Location: [facility];  Service: Orthopedics;  Laterality: Left;  sleep apnea   CARPAL TUNNEL RELEASE Right    EXCISION MASS UPPER EXTREMETIES Right 09/19/2022   Procedure: EXCISION OF SOFT TISSUE MASS, RIGHT TIMES 4, UPPER EXTREMITIES;  Surgeon: Corky Mull, MD;  Location: [facility];  Service: Orthopedics;  Laterality: Right;   HERNIA REPAIR     LEFT HEART CATH AND CORONARY ANGIOGRAPHY Left 08/21/2017   Procedure: LEFT HEART CATH AND CORONARY ANGIOGRAPHY;  Surgeon: Yolonda Kida, MD;  Location: [facility];  Service: Cardiovascular;  Laterality: Left;   SHOULDER ARTHROSCOPY WITH OPEN ROTATOR CUFF REPAIR Left 03/06/2018   Procedure: SHOULDER ARTHROSCOPY WITH OPEN ROTATOR CUFF REPAIR;  Surgeon: Corky Mull, MD;  Location: [facility];  Service: Orthopedics;   Laterality: Left;   TENNIS ELBOW RELEASE/NIRSCHEL PROCEDURE     WISDOM TOOTH EXTRACTION      Family History No h/o GI disease or malignancy  Review of Systems  Constitutional:  Negative for activity change, appetite change, chills, diaphoresis, fatigue, fever and unexpected weight change.  HENT:  Negative for trouble swallowing and voice change.   Respiratory:  Negative for shortness of breath and wheezing.   Cardiovascular:  Negative for chest pain, palpitations and leg swelling.  Gastrointestinal:  Negative for abdominal distention, abdominal pain, anal bleeding, blood in stool, constipation, diarrhea, nausea and vomiting.  Musculoskeletal:  Negative for arthralgias and myalgias.  Skin:  Negative for color change and pallor.  Neurological:  Negative for dizziness, syncope and weakness.  Psychiatric/Behavioral:  Negative for confusion. The patient is not nervous/anxious.   All other systems reviewed and are negative.    Medications No current facility-administered medications on file prior to encounter.   Current Outpatient Medications on File Prior to Encounter  Medication Sig Dispense Refill   acetaminophen (TYLENOL) 500 MG tablet Take 1,000 mg by mouth every 8 (eight) hours as needed for mild pain or moderate pain.     albuterol (PROVENTIL HFA;VENTOLIN HFA) 108 (90 Base) MCG/ACT inhaler Inhale into the lungs every 6 (six) hours as needed for wheezing or shortness of breath.     ALPRAZolam (XANAX) 1 MG tablet Take 1 tablet by mouth at bedtime.  1   ARIPiprazole (  ABILIFY) 10 MG tablet Take 10 mg by mouth daily.     fexofenadine (ALLEGRA) 180 MG tablet Take 180 mg by mouth daily.     HYDROcodone-acetaminophen (NORCO) 7.5-325 MG tablet Take 1 tablet by mouth every 8 (eight) hours as needed for moderate pain.     hydrocortisone 2.5 % cream Apply topically 2 (two) times daily as needed.     ibuprofen (ADVIL,MOTRIN) 400 MG tablet Take 800 mg by mouth every 6 (six) hours as needed.      losartan (COZAAR) 100 MG tablet Take 100 mg by mouth daily.     mirtazapine (REMERON) 15 MG tablet Take 15 mg by mouth at bedtime.     Multiple Vitamin (MULTIVITAMIN) tablet Take 1 tablet by mouth daily.     omeprazole (PRILOSEC) 20 MG capsule Take 1 capsule by mouth 2 (two) times daily before a meal.  0   PARoxetine (PAXIL) 40 MG tablet Take 1 tablet by mouth daily.  0   rosuvastatin (CRESTOR) 20 MG tablet Take 20 mg by mouth daily.     tiZANidine (ZANAFLEX) 2 MG tablet Take 1 tablet by mouth 2 (two) times daily as needed for muscle spasms.   2   traZODone (DESYREL) 100 MG tablet Take 1 tablet by mouth at bedtime as needed for sleep.  0    Pertinent medications related to GI and procedure were reviewed by me with the patient prior to the procedure  No current facility-administered medications for this encounter.  Current Outpatient Medications:    acetaminophen (TYLENOL) 500 MG tablet, Take 1,000 mg by mouth every 8 (eight) hours as needed for mild pain or moderate pain., Disp: , Rfl:    albuterol (PROVENTIL HFA;VENTOLIN HFA) 108 (90 Base) MCG/ACT inhaler, Inhale into the lungs every 6 (six) hours as needed for wheezing or shortness of breath., Disp: , Rfl:    ALPRAZolam (XANAX) 1 MG tablet, Take 1 tablet by mouth at bedtime., Disp: , Rfl: 1   ARIPiprazole (ABILIFY) 10 MG tablet, Take 10 mg by mouth daily., Disp: , Rfl:    fexofenadine (ALLEGRA) 180 MG tablet, Take 180 mg by mouth daily., Disp: , Rfl:    HYDROcodone-acetaminophen (NORCO) 7.5-325 MG tablet, Take 1 tablet by mouth every 8 (eight) hours as needed for moderate pain., Disp: , Rfl:    hydrocortisone 2.5 % cream, Apply topically 2 (two) times daily as needed., Disp: , Rfl:    ibuprofen (ADVIL,MOTRIN) 400 MG tablet, Take 800 mg by mouth every 6 (six) hours as needed., Disp: , Rfl:    losartan (COZAAR) 100 MG tablet, Take 100 mg by mouth daily., Disp: , Rfl:    mirtazapine (REMERON) 15 MG tablet, Take 15 mg by mouth at bedtime.,  Disp: , Rfl:    Multiple Vitamin (MULTIVITAMIN) tablet, Take 1 tablet by mouth daily., Disp: , Rfl:    omeprazole (PRILOSEC) 20 MG capsule, Take 1 capsule by mouth 2 (two) times daily before a meal., Disp: , Rfl: 0   PARoxetine (PAXIL) 40 MG tablet, Take 1 tablet by mouth daily., Disp: , Rfl: 0   rosuvastatin (CRESTOR) 20 MG tablet, Take 20 mg by mouth daily., Disp: , Rfl:    tiZANidine (ZANAFLEX) 2 MG tablet, Take 1 tablet by mouth 2 (two) times daily as needed for muscle spasms. , Disp: , Rfl: 2   traZODone (DESYREL) 100 MG tablet, Take 1 tablet by mouth at bedtime as needed for sleep., Disp: , Rfl: 0      No  Known Allergies Allergies were reviewed by me prior to the procedure  Objective   There is no height or weight on file to calculate BMI. There were no vitals filed for this visit.  *** Physical Exam Vitals and nursing note reviewed.  Constitutional:      General: He is not in acute distress.    Appearance: Normal appearance. He is not ill-appearing, toxic-appearing or diaphoretic.  HENT:     Head: Normocephalic and atraumatic.     Nose: Nose normal.     Mouth/Throat:     Mouth: Mucous membranes are moist.     Pharynx: Oropharynx is clear.  Eyes:     General: No scleral icterus.    Extraocular Movements: Extraocular movements intact.  Cardiovascular:     Rate and Rhythm: Normal rate and regular rhythm.     Heart sounds: Normal heart sounds. No murmur heard.    No friction rub. No gallop.  Pulmonary:     Effort: Pulmonary effort is normal. No respiratory distress.     Breath sounds: Normal breath sounds. No wheezing, rhonchi or rales.  Abdominal:     General: Bowel sounds are normal. There is no distension.     Palpations: Abdomen is soft.     Tenderness: There is no abdominal tenderness. There is no guarding or rebound.  Musculoskeletal:     Cervical back: Neck supple.     Right lower leg: No edema.     Left lower leg: No edema.  Skin:    General: Skin is warm  and dry.     Coloration: Skin is not jaundiced or pale.  Neurological:     General: No focal deficit present.     Mental Status: He is alert and oriented to person, place, and time. Mental status is at baseline.  Psychiatric:        Mood and Affect: Mood normal.        Behavior: Behavior normal.        Thought Content: Thought content normal.        Judgment: Judgment normal.      Assessment:  Joel Wheeler is a 57 y.o. male  who presents today for Colonoscopy for Initial screening colonoscopy .  Plan:  Colonoscopy with possible intervention today  Colonoscopy with possible biopsy, control of bleeding, polypectomy, and interventions as necessary has been discussed with the patient/patient representative. Informed consent was obtained from the patient/patient representative after explaining the indication, nature, and risks of the procedure including but not limited to death, bleeding, perforation, missed neoplasm/lesions, cardiorespiratory compromise, and reaction to medications. Opportunity for questions was given and appropriate answers were provided. Patient/patient representative has verbalized understanding is amenable to undergoing the procedure.   Annamaria Helling, DO  [facility]  Portions of the record may have been created with voice recognition software. Occasional wrong-word or 'sound-a-like' substitutions may have occurred due to the inherent limitations of voice recognition software.  Read the chart carefully and recognize, using context, where substitutions may have occurred.

## 2022-12-10 ENCOUNTER — Encounter: Payer: Self-pay | Admitting: General Practice

## 2022-12-10 ENCOUNTER — Ambulatory Visit
Admission: RE | Admit: 2022-12-10 | Payer: Commercial Managed Care - HMO | Source: Home / Self Care | Admitting: Gastroenterology

## 2022-12-10 ENCOUNTER — Encounter: Admission: RE | Payer: Self-pay | Source: Home / Self Care

## 2022-12-10 SURGERY — COLONOSCOPY WITH PROPOFOL
Anesthesia: General

## 2022-12-31 DIAGNOSIS — K219 Gastro-esophageal reflux disease without esophagitis: Secondary | ICD-10-CM | POA: Diagnosis not present

## 2022-12-31 DIAGNOSIS — R69 Illness, unspecified: Secondary | ICD-10-CM | POA: Diagnosis not present

## 2022-12-31 DIAGNOSIS — Z79891 Long term (current) use of opiate analgesic: Secondary | ICD-10-CM | POA: Diagnosis not present

## 2022-12-31 DIAGNOSIS — Z6838 Body mass index (BMI) 38.0-38.9, adult: Secondary | ICD-10-CM | POA: Diagnosis not present

## 2022-12-31 DIAGNOSIS — I10 Essential (primary) hypertension: Secondary | ICD-10-CM | POA: Diagnosis not present

## 2022-12-31 DIAGNOSIS — E1165 Type 2 diabetes mellitus with hyperglycemia: Secondary | ICD-10-CM | POA: Diagnosis not present

## 2022-12-31 DIAGNOSIS — G894 Chronic pain syndrome: Secondary | ICD-10-CM | POA: Diagnosis not present
